# Patient Record
Sex: Female | Born: 1985
Health system: Southern US, Community
[De-identification: ages and names within clinical notes are randomized; demographics above are authoritative.]

## PROBLEM LIST (undated history)

## (undated) ENCOUNTER — Inpatient Hospital Stay (HOSPITAL_COMMUNITY): Payer: Self-pay

## (undated) DIAGNOSIS — Z789 Other specified health status: Secondary | ICD-10-CM

## (undated) DIAGNOSIS — D649 Anemia, unspecified: Secondary | ICD-10-CM

## (undated) HISTORY — PX: OTHER SURGICAL HISTORY: SHX169

---

## 2009-10-20 ENCOUNTER — Emergency Department (HOSPITAL_COMMUNITY): Admission: EM | Admit: 2009-10-20 | Discharge: 2009-10-21 | Payer: Self-pay | Admitting: Emergency Medicine

## 2009-11-30 ENCOUNTER — Emergency Department (HOSPITAL_COMMUNITY): Admission: EM | Admit: 2009-11-30 | Discharge: 2009-11-30 | Payer: Self-pay | Admitting: Family Medicine

## 2009-12-02 ENCOUNTER — Emergency Department (HOSPITAL_COMMUNITY): Admission: EM | Admit: 2009-12-02 | Discharge: 2009-12-02 | Payer: Self-pay | Admitting: Family Medicine

## 2010-04-26 ENCOUNTER — Emergency Department (HOSPITAL_COMMUNITY)
Admission: EM | Admit: 2010-04-26 | Discharge: 2010-04-26 | Payer: Self-pay | Source: Home / Self Care | Admitting: Family Medicine

## 2010-05-25 LAB — RUBELLA ANTIBODY, IGM: Rubella: IMMUNE

## 2010-05-25 LAB — HEPATITIS B SURFACE ANTIGEN: Hepatitis B Surface Ag: NEGATIVE

## 2010-05-25 LAB — ABO/RH: RH Type: POSITIVE

## 2010-05-25 LAB — CBC: HCT: 37 % (ref 36–46)

## 2010-08-06 LAB — URINALYSIS, ROUTINE W REFLEX MICROSCOPIC
Bilirubin Urine: NEGATIVE
Glucose, UA: NEGATIVE mg/dL
Hgb urine dipstick: NEGATIVE
Ketones, ur: 15 mg/dL — AB
Nitrite: NEGATIVE
Protein, ur: NEGATIVE mg/dL
Specific Gravity, Urine: 1.022 (ref 1.005–1.030)
Urobilinogen, UA: 0.2 mg/dL (ref 0.0–1.0)
pH: 5.5 (ref 5.0–8.0)

## 2010-08-06 LAB — POCT I-STAT, CHEM 8
BUN: 12 mg/dL (ref 6–23)
Calcium, Ion: 1.07 mmol/L — ABNORMAL LOW (ref 1.12–1.32)
Chloride: 102 mEq/L (ref 96–112)
Creatinine, Ser: 0.8 mg/dL (ref 0.4–1.2)
Glucose, Bld: 135 mg/dL — ABNORMAL HIGH (ref 70–99)
HCT: 40 % (ref 36.0–46.0)
Hemoglobin: 13.6 g/dL (ref 12.0–15.0)
Potassium: 3.5 meq/L (ref 3.5–5.1)
Sodium: 137 mEq/L (ref 135–145)
TCO2: 26 mmol/L (ref 0–100)

## 2010-08-06 LAB — PREGNANCY, URINE: Preg Test, Ur: NEGATIVE

## 2010-08-13 ENCOUNTER — Inpatient Hospital Stay (INDEPENDENT_AMBULATORY_CARE_PROVIDER_SITE_OTHER)
Admission: RE | Admit: 2010-08-13 | Discharge: 2010-08-13 | Disposition: A | Discharge status: Home / Self Care | Payer: Medicaid Other | Source: Ambulatory Visit | Attending: Family Medicine | Admitting: Family Medicine

## 2010-08-13 DIAGNOSIS — H109 Unspecified conjunctivitis: Secondary | ICD-10-CM

## 2010-09-09 ENCOUNTER — Emergency Department (HOSPITAL_COMMUNITY)
Admission: EM | Admit: 2010-09-09 | Discharge: 2010-09-09 | Disposition: A | Discharge status: Other Acute Inpatient Hospital | Payer: Medicaid Other | Attending: Emergency Medicine | Admitting: Emergency Medicine

## 2010-09-09 ENCOUNTER — Inpatient Hospital Stay (HOSPITAL_COMMUNITY)
Admission: AD | Admit: 2010-09-09 | Discharge: 2010-09-09 | Disposition: A | Discharge status: Home / Self Care | Payer: Medicaid Other | Source: Ambulatory Visit | Attending: Obstetrics and Gynecology | Admitting: Obstetrics and Gynecology

## 2010-09-09 DIAGNOSIS — R109 Unspecified abdominal pain: Secondary | ICD-10-CM | POA: Insufficient documentation

## 2010-09-09 DIAGNOSIS — O99891 Other specified diseases and conditions complicating pregnancy: Secondary | ICD-10-CM | POA: Insufficient documentation

## 2010-09-09 DIAGNOSIS — S3981XA Other specified injuries of abdomen, initial encounter: Secondary | ICD-10-CM | POA: Insufficient documentation

## 2010-09-09 DIAGNOSIS — R350 Frequency of micturition: Secondary | ICD-10-CM | POA: Insufficient documentation

## 2010-09-09 DIAGNOSIS — R51 Headache: Secondary | ICD-10-CM | POA: Insufficient documentation

## 2010-09-09 DIAGNOSIS — M549 Dorsalgia, unspecified: Secondary | ICD-10-CM | POA: Insufficient documentation

## 2010-09-09 DIAGNOSIS — O9989 Other specified diseases and conditions complicating pregnancy, childbirth and the puerperium: Secondary | ICD-10-CM | POA: Insufficient documentation

## 2010-09-09 DIAGNOSIS — R300 Dysuria: Secondary | ICD-10-CM | POA: Insufficient documentation

## 2010-09-09 DIAGNOSIS — N898 Other specified noninflammatory disorders of vagina: Secondary | ICD-10-CM | POA: Insufficient documentation

## 2010-09-09 LAB — URINALYSIS, ROUTINE W REFLEX MICROSCOPIC
Glucose, UA: NEGATIVE mg/dL
Hgb urine dipstick: NEGATIVE
Protein, ur: NEGATIVE mg/dL
pH: 7.5 (ref 5.0–8.0)

## 2010-10-26 ENCOUNTER — Inpatient Hospital Stay (HOSPITAL_COMMUNITY)
Admission: AD | Admit: 2010-10-26 | Discharge: 2010-10-26 | Disposition: A | Discharge status: Home / Self Care | Payer: Medicaid Other | Source: Ambulatory Visit | Attending: Obstetrics and Gynecology | Admitting: Obstetrics and Gynecology

## 2010-10-26 DIAGNOSIS — O99891 Other specified diseases and conditions complicating pregnancy: Secondary | ICD-10-CM

## 2010-10-26 DIAGNOSIS — O9989 Other specified diseases and conditions complicating pregnancy, childbirth and the puerperium: Secondary | ICD-10-CM

## 2010-10-26 DIAGNOSIS — R109 Unspecified abdominal pain: Secondary | ICD-10-CM | POA: Insufficient documentation

## 2010-10-26 LAB — WET PREP, GENITAL
Clue Cells Wet Prep HPF POC: NONE SEEN
Trich, Wet Prep: NONE SEEN
Yeast Wet Prep HPF POC: NONE SEEN

## 2010-10-26 LAB — URINALYSIS, ROUTINE W REFLEX MICROSCOPIC
Glucose, UA: NEGATIVE mg/dL
Ketones, ur: 40 mg/dL — AB
Leukocytes, UA: NEGATIVE
Nitrite: NEGATIVE
Specific Gravity, Urine: 1.025 (ref 1.005–1.030)
pH: 7 (ref 5.0–8.0)

## 2010-11-04 ENCOUNTER — Inpatient Hospital Stay (HOSPITAL_COMMUNITY)
Admission: AD | Admit: 2010-11-04 | Discharge: 2010-11-05 | Disposition: A | Discharge status: Home / Self Care | Payer: Medicaid Other | Source: Ambulatory Visit | Attending: Obstetrics and Gynecology | Admitting: Obstetrics and Gynecology

## 2010-11-04 DIAGNOSIS — R42 Dizziness and giddiness: Secondary | ICD-10-CM

## 2010-11-04 DIAGNOSIS — O9989 Other specified diseases and conditions complicating pregnancy, childbirth and the puerperium: Secondary | ICD-10-CM

## 2010-11-04 DIAGNOSIS — O99891 Other specified diseases and conditions complicating pregnancy: Secondary | ICD-10-CM | POA: Insufficient documentation

## 2010-11-05 LAB — URINALYSIS, ROUTINE W REFLEX MICROSCOPIC
Hgb urine dipstick: NEGATIVE
Nitrite: NEGATIVE
Protein, ur: NEGATIVE mg/dL
Specific Gravity, Urine: 1.01 (ref 1.005–1.030)
Urobilinogen, UA: 0.2 mg/dL (ref 0.0–1.0)

## 2010-11-05 LAB — CBC
HCT: 30.9 % — ABNORMAL LOW (ref 36.0–46.0)
MCHC: 33 g/dL (ref 30.0–36.0)
Platelets: 156 10*3/uL (ref 150–400)
RDW: 14.5 % (ref 11.5–15.5)
WBC: 7 10*3/uL (ref 4.0–10.5)

## 2010-11-05 LAB — DIFFERENTIAL
Basophils Absolute: 0 10*3/uL (ref 0.0–0.1)
Basophils Relative: 0 % (ref 0–1)
Eosinophils Absolute: 0.1 10*3/uL (ref 0.0–0.7)
Eosinophils Relative: 1 % (ref 0–5)
Lymphocytes Relative: 18 % (ref 12–46)
Monocytes Absolute: 0.4 10*3/uL (ref 0.1–1.0)

## 2010-11-15 LAB — STREP B DNA PROBE: GBS: POSITIVE

## 2010-11-27 ENCOUNTER — Encounter (HOSPITAL_COMMUNITY): Payer: Self-pay

## 2010-11-27 ENCOUNTER — Inpatient Hospital Stay (HOSPITAL_COMMUNITY)
Admission: AD | Admit: 2010-11-27 | Discharge: 2010-11-27 | Disposition: A | Discharge status: Home / Self Care | Payer: Medicaid Other | Source: Ambulatory Visit | Attending: Obstetrics and Gynecology | Admitting: Obstetrics and Gynecology

## 2010-11-27 DIAGNOSIS — O212 Late vomiting of pregnancy: Secondary | ICD-10-CM | POA: Insufficient documentation

## 2010-11-27 DIAGNOSIS — R112 Nausea with vomiting, unspecified: Secondary | ICD-10-CM

## 2010-11-27 DIAGNOSIS — O479 False labor, unspecified: Secondary | ICD-10-CM

## 2010-11-27 HISTORY — DX: Other specified health status: Z78.9

## 2010-11-27 LAB — URINALYSIS, ROUTINE W REFLEX MICROSCOPIC
Glucose, UA: NEGATIVE mg/dL
Hgb urine dipstick: NEGATIVE
Ketones, ur: NEGATIVE mg/dL
Leukocytes, UA: NEGATIVE
Protein, ur: NEGATIVE mg/dL
Urobilinogen, UA: 0.2 mg/dL (ref 0.0–1.0)

## 2010-11-27 MED ORDER — PROMETHAZINE HCL 25 MG PO TABS: 25.0000 mg | ORAL_TABLET | Freq: Four times a day (QID) | ORAL | Status: DC | PRN

## 2010-11-27 NOTE — ED Provider Notes (Signed)
History   Pt presents today c/o one episode of N&V and one episode of diarrhea since last pm. She has also had some lower abd pressure. She denies vag dc, bleeding, fever, or any other sx at this time. She reports GFM.  Chief Complaint  Patient presents with  . Abdominal Pain   HPI  OB History    Grav Para Term Preterm Abortions TAB SAB Ect Mult Living   1               Past Medical History  Diagnosis Date  . No pertinent past medical history     Past Surgical History  Procedure Date  . Vaginal polyps removed     Family History  Problem Relation Age of Onset  . Heart disease Maternal Grandmother   . Heart disease Maternal Grandfather   . Diabetes Paternal Grandfather     History  Substance Use Topics  . Smoking status: Never Smoker   . Smokeless tobacco: Not on file  . Alcohol Use: No    Allergies: No Known Allergies  Prescriptions prior to admission  Medication Sig Dispense Refill  . ferrous sulfate 325 (65 FE) MG tablet Take 325 mg by mouth 2 (two) times daily. For supplement       . prenatal vitamin w/FE, FA (PRENATAL 1 + 1) 27-1 MG TABS Take 1 tablet by mouth daily.          Review of Systems  Constitutional: Negative for fever, chills and malaise/fatigue.  Cardiovascular: Negative for chest pain.  Gastrointestinal: Positive for nausea, vomiting, abdominal pain and diarrhea.  Genitourinary: Negative for dysuria, urgency, frequency and hematuria.  Neurological: Negative for dizziness, weakness and headaches.  Psychiatric/Behavioral: Negative for depression and suicidal ideas.   Physical Exam   Blood pressure 138/82, pulse 99, temperature 97.7 F (36.5 C), temperature source Oral, resp. rate 20, height 5\' 4"  (1.626 m), weight 205 lb 12.8 oz (93.35 kg).  Physical Exam  Constitutional: She is oriented to person, place, and time. She appears well-developed and well-nourished. No distress.  Respiratory: Effort normal.  GI: She exhibits no distension. There  is no tenderness. There is no rebound and no guarding.  Genitourinary: No tenderness or bleeding around the vagina. No vaginal discharge found.       Cervix Cl/50/-3.  Neurological: She is alert and oriented to person, place, and time. She is not disoriented.  Psychiatric: She has a normal mood and affect. Her speech is normal and behavior is normal. Thought content normal.    MAU Course  Procedures  NST reactive with irregular ctx. Dr. Ellyn Hack notified. Will dc to home with Rx for phenergan. She has a f/u appt scheduled. Discussed signs and sx of labor. Discussed diet, activity, risks, and precautions.  Henrietta Hoover, [place] 11/27/10 [phone]

## 2010-11-27 NOTE — Initial Assessments (Signed)
Diarrhea since 2200 last night, vomitting started @ 0630 this a.m.  Pt feeling lower abd pressure, denies bleeding or LOF.

## 2010-11-27 NOTE — Progress Notes (Signed)
Pt states she did not feel well last night and had diarrhea. No diarrhea this am but had one episode of vomiting and is feeling abdominal pressure. No bleeding or leaking. Reports good fetal movement.

## 2010-12-03 ENCOUNTER — Encounter (HOSPITAL_COMMUNITY): Payer: Self-pay | Admitting: *Deleted

## 2010-12-03 ENCOUNTER — Inpatient Hospital Stay (HOSPITAL_COMMUNITY)
Admission: AD | Admit: 2010-12-03 | Discharge: 2010-12-03 | Disposition: A | Discharge status: Home / Self Care | Payer: Medicaid Other | Source: Ambulatory Visit | Attending: Obstetrics and Gynecology | Admitting: Obstetrics and Gynecology

## 2010-12-03 DIAGNOSIS — O479 False labor, unspecified: Secondary | ICD-10-CM | POA: Insufficient documentation

## 2010-12-03 HISTORY — DX: Anemia, unspecified: D64.9

## 2010-12-03 NOTE — Progress Notes (Signed)
Pt states she had a little bloody show at 1200, now having contractions every 5 minutes. Reports good fetal movement. No leaking

## 2010-12-03 NOTE — Progress Notes (Signed)
Pt states she has been contracting q 5 min and they are lasting for almost 60 sec for less than an hour.

## 2010-12-04 ENCOUNTER — Encounter (HOSPITAL_COMMUNITY): Payer: Self-pay | Admitting: Anesthesiology

## 2010-12-04 ENCOUNTER — Encounter (HOSPITAL_COMMUNITY)
Admission: AD | Disposition: A | Discharge status: Home / Self Care | Payer: Self-pay | Source: Ambulatory Visit | Attending: Obstetrics and Gynecology

## 2010-12-04 ENCOUNTER — Encounter (HOSPITAL_COMMUNITY): Payer: Self-pay | Admitting: *Deleted

## 2010-12-04 ENCOUNTER — Inpatient Hospital Stay (HOSPITAL_COMMUNITY): Payer: Medicaid Other | Admitting: Anesthesiology

## 2010-12-04 ENCOUNTER — Inpatient Hospital Stay (HOSPITAL_COMMUNITY)
Admission: AD | Admit: 2010-12-04 | Discharge: 2010-12-07 | DRG: 765 | Disposition: A | Discharge status: Home / Self Care | Payer: Medicaid Other | Source: Ambulatory Visit | Attending: Obstetrics and Gynecology | Admitting: Obstetrics and Gynecology

## 2010-12-04 DIAGNOSIS — Z3490 Encounter for supervision of normal pregnancy, unspecified, unspecified trimester: Secondary | ICD-10-CM

## 2010-12-04 DIAGNOSIS — Z22330 Carrier of Group B streptococcus: Secondary | ICD-10-CM

## 2010-12-04 DIAGNOSIS — O99892 Other specified diseases and conditions complicating childbirth: Secondary | ICD-10-CM | POA: Diagnosis present

## 2010-12-04 LAB — CBC
HCT: 36 % (ref 36.0–46.0)
Hemoglobin: 11.9 g/dL — ABNORMAL LOW (ref 12.0–15.0)
MCHC: 33.1 g/dL (ref 30.0–36.0)
RBC: 3.83 MIL/uL — ABNORMAL LOW (ref 3.87–5.11)

## 2010-12-04 LAB — RPR: RPR Ser Ql: NONREACTIVE

## 2010-12-04 SURGERY — Surgical Case
Anesthesia: Epidural | Site: Abdomen | Wound class: Clean Contaminated

## 2010-12-04 MED ORDER — ONDANSETRON HCL 4 MG/2ML IJ SOLN
INTRAMUSCULAR | Status: DC | PRN
  Administered 2010-12-04: 4 mg via INTRAVENOUS

## 2010-12-04 MED ORDER — FLEET ENEMA 7-19 GM/118ML RE ENEM: 1.0000 | ENEMA | RECTAL | Status: DC | PRN

## 2010-12-04 MED ORDER — OXYTOCIN 10 UNIT/ML IJ SOLN
INTRAMUSCULAR | Status: AC
  Filled 2010-12-04: qty 4

## 2010-12-04 MED ORDER — MORPHINE SULFATE 0.5 MG/ML IJ SOLN
INTRAMUSCULAR | Status: AC
  Filled 2010-12-04: qty 10

## 2010-12-04 MED ORDER — TERBUTALINE SULFATE 1 MG/ML IJ SOLN: 0.2500 mg | Freq: Once | INTRAMUSCULAR | Status: AC | PRN

## 2010-12-04 MED ORDER — PHENYLEPHRINE 40 MCG/ML (10ML) SYRINGE FOR IV PUSH (FOR BLOOD PRESSURE SUPPORT)
80.0000 ug | PREFILLED_SYRINGE | INTRAVENOUS | Status: DC | PRN
  Filled 2010-12-04 (×2): qty 5

## 2010-12-04 MED ORDER — SODIUM BICARBONATE 8.4 % IV SOLN
INTRAVENOUS | Status: DC | PRN
  Administered 2010-12-04: 5 mL via EPIDURAL

## 2010-12-04 MED ORDER — DIPHENHYDRAMINE HCL 50 MG/ML IJ SOLN: 12.5000 mg | INTRAMUSCULAR | Status: DC | PRN

## 2010-12-04 MED ORDER — PHENYLEPHRINE 40 MCG/ML (10ML) SYRINGE FOR IV PUSH (FOR BLOOD PRESSURE SUPPORT)
80.0000 ug | PREFILLED_SYRINGE | INTRAVENOUS | Status: DC | PRN
  Filled 2010-12-04: qty 5

## 2010-12-04 MED ORDER — FENTANYL 2.5 MCG/ML BUPIVACAINE 1/10 % EPIDURAL INFUSION (WH - ANES)
INTRAMUSCULAR | Status: DC | PRN
  Administered 2010-12-04: 14 mL/h via EPIDURAL

## 2010-12-04 MED ORDER — ACETAMINOPHEN 325 MG PO TABS: 650.0000 mg | ORAL_TABLET | ORAL | Status: DC | PRN

## 2010-12-04 MED ORDER — OXYTOCIN 20 UNITS IN LACTATED RINGERS INFUSION - SIMPLE
INTRAVENOUS | Status: DC | PRN
  Administered 2010-12-04 – 2010-12-05 (×2): 20 [IU] via INTRAVENOUS

## 2010-12-04 MED ORDER — OXYTOCIN 20 UNITS IN LACTATED RINGERS INFUSION - SIMPLE
1.0000 m[IU]/min | INTRAVENOUS | Status: DC
  Administered 2010-12-04: 3 m[IU]/min via INTRAVENOUS

## 2010-12-04 MED ORDER — EPHEDRINE 5 MG/ML INJ
10.0000 mg | INTRAVENOUS | Status: DC | PRN
  Administered 2010-12-04: 20 mg via INTRAVENOUS
  Filled 2010-12-04 (×2): qty 4

## 2010-12-04 MED ORDER — LIDOCAINE HCL (PF) 1 % IJ SOLN: 30.0000 mL | INTRAMUSCULAR | Status: DC | PRN

## 2010-12-04 MED ORDER — SODIUM BICARBONATE 8.4 % IV SOLN
INTRAVENOUS | Status: AC
  Filled 2010-12-04: qty 50

## 2010-12-04 MED ORDER — LACTATED RINGERS IV SOLN: 500.0000 mL | INTRAVENOUS | Status: DC | PRN

## 2010-12-04 MED ORDER — LACTATED RINGERS IV SOLN
INTRAVENOUS | Status: DC
  Administered 2010-12-04 (×4): via INTRAVENOUS

## 2010-12-04 MED ORDER — ONDANSETRON HCL 4 MG/2ML IJ SOLN
INTRAMUSCULAR | Status: AC
  Filled 2010-12-04: qty 2

## 2010-12-04 MED ORDER — OXYTOCIN 20 UNITS IN LACTATED RINGERS INFUSION - SIMPLE
1.0000 m[IU]/min | INTRAVENOUS | Status: DC
  Administered 2010-12-04: 2 m[IU]/min via INTRAVENOUS
  Filled 2010-12-04: qty 1000

## 2010-12-04 MED ORDER — PENICILLIN G POTASSIUM 5000000 UNITS IJ SOLR
2.5000 10*6.[IU] | INTRAVENOUS | Status: DC
  Administered 2010-12-04 (×3): 2.5 10*6.[IU] via INTRAVENOUS
  Filled 2010-12-04 (×5): qty 2.5

## 2010-12-04 MED ORDER — SODIUM CHLORIDE 0.9 % IV SOLN
3.0000 g | Freq: Four times a day (QID) | INTRAVENOUS | Status: DC
  Administered 2010-12-04: 3 g via INTRAVENOUS
  Filled 2010-12-04 (×4): qty 3

## 2010-12-04 MED ORDER — LACTATED RINGERS IV SOLN
INTRAVENOUS | Status: DC | PRN
  Administered 2010-12-04 – 2010-12-05 (×4): via INTRAVENOUS

## 2010-12-04 MED ORDER — IBUPROFEN 600 MG PO TABS: 600.0000 mg | ORAL_TABLET | Freq: Four times a day (QID) | ORAL | Status: DC | PRN

## 2010-12-04 MED ORDER — PENICILLIN G POTASSIUM 5000000 UNITS IJ SOLR
5.0000 10*6.[IU] | Freq: Once | INTRAVENOUS | Status: DC
  Administered 2010-12-04: 5 10*6.[IU] via INTRAVENOUS
  Filled 2010-12-04: qty 5

## 2010-12-04 MED ORDER — EPHEDRINE 5 MG/ML INJ
10.0000 mg | INTRAVENOUS | Status: DC | PRN
  Filled 2010-12-04: qty 4

## 2010-12-04 MED ORDER — KETOROLAC TROMETHAMINE 60 MG/2ML IM SOLN
60.0000 mg | Freq: Once | INTRAMUSCULAR | Status: AC | PRN
  Administered 2010-12-05: 60 mg via INTRAMUSCULAR

## 2010-12-04 MED ORDER — PENICILLIN G POTASSIUM 5000000 UNITS IJ SOLR: 5.0000 10*6.[IU] | INTRAMUSCULAR | Status: DC

## 2010-12-04 MED ORDER — OXYCODONE-ACETAMINOPHEN 5-325 MG PO TABS: 2.0000 | ORAL_TABLET | ORAL | Status: DC | PRN

## 2010-12-04 MED ORDER — CITRIC ACID-SODIUM CITRATE 334-500 MG/5ML PO SOLN
30.0000 mL | ORAL | Status: DC | PRN
  Administered 2010-12-04: 30 mL via ORAL
  Filled 2010-12-04: qty 15

## 2010-12-04 MED ORDER — MORPHINE SULFATE (PF) 0.5 MG/ML IJ SOLN
INTRAMUSCULAR | Status: DC | PRN
  Administered 2010-12-04: 4 mg via EPIDURAL
  Administered 2010-12-05: 1 mg via INTRAVENOUS

## 2010-12-04 MED ORDER — ONDANSETRON HCL 4 MG/2ML IJ SOLN: 4.0000 mg | Freq: Four times a day (QID) | INTRAMUSCULAR | Status: DC | PRN

## 2010-12-04 MED ORDER — SCOPOLAMINE 1 MG/3DAYS TD PT72
1.0000 | MEDICATED_PATCH | Freq: Once | TRANSDERMAL | Status: DC
  Administered 2010-12-05: 1.5 mg via TRANSDERMAL

## 2010-12-04 MED ORDER — NALBUPHINE SYRINGE 5 MG/0.5 ML
10.0000 mg | INJECTION | INTRAMUSCULAR | Status: DC | PRN
  Administered 2010-12-04: 10 mg via INTRAVENOUS
  Filled 2010-12-04: qty 1

## 2010-12-04 MED ORDER — LIDOCAINE-EPINEPHRINE (PF) 2 %-1:200000 IJ SOLN
INTRAMUSCULAR | Status: AC
  Filled 2010-12-04: qty 20

## 2010-12-04 MED ORDER — LACTATED RINGERS IV SOLN
500.0000 mL | Freq: Once | INTRAVENOUS | Status: AC
  Administered 2010-12-04: 500 mL via INTRAVENOUS

## 2010-12-04 MED ORDER — OXYTOCIN 20 UNITS IN LACTATED RINGERS INFUSION - SIMPLE
125.0000 mL/h | Freq: Once | INTRAVENOUS | Status: DC
  Administered 2010-12-04: 125 mL/h via INTRAVENOUS

## 2010-12-04 MED ORDER — FENTANYL 2.5 MCG/ML BUPIVACAINE 1/10 % EPIDURAL INFUSION (WH - ANES)
14.0000 mL/h | INTRAMUSCULAR | Status: DC
  Administered 2010-12-04 (×2): 14 mL/h via EPIDURAL
  Filled 2010-12-04 (×3): qty 60

## 2010-12-04 SURGICAL SUPPLY — 29 items
CHLORAPREP W/TINT 26ML (MISCELLANEOUS) ×2 IMPLANT
CLOTH BEACON ORANGE TIMEOUT ST (SAFETY) ×2 IMPLANT
CONTAINER PREFILL 10% NBF 15ML (MISCELLANEOUS) IMPLANT
DRAPE UTILITY XL STRL (DRAPES) ×2 IMPLANT
ELECT REM PT RETURN 9FT ADLT (ELECTROSURGICAL) ×2
ELECTRODE REM PT RTRN 9FT ADLT (ELECTROSURGICAL) ×1 IMPLANT
EXTRACTOR VACUUM KIWI (MISCELLANEOUS) IMPLANT
EXTRACTOR VACUUM M CUP 4 TUBE (SUCTIONS) IMPLANT
GLOVE BIO SURGEON STRL SZ 6.5 (GLOVE) ×4 IMPLANT
GLOVE BIO SURGEON STRL SZ8 (GLOVE) ×2 IMPLANT
GOWN BRE IMP SLV AUR LG STRL (GOWN DISPOSABLE) ×4 IMPLANT
KIT ABG SYR 3ML LUER SLIP (SYRINGE) IMPLANT
NEEDLE HYPO 25X5/8 SAFETYGLIDE (NEEDLE) IMPLANT
NS IRRIG 1000ML POUR BTL (IV SOLUTION) ×2 IMPLANT
PACK C SECTION WH (CUSTOM PROCEDURE TRAY) ×2 IMPLANT
RTRCTR C-SECT PINK 25CM LRG (MISCELLANEOUS) ×2 IMPLANT
SLEEVE SCD COMPRESS KNEE MED (MISCELLANEOUS) ×2 IMPLANT
STAPLER VISISTAT 35W (STAPLE) IMPLANT
SUT CHROMIC 1 CTX 36 (SUTURE) ×4 IMPLANT
SUT PLAIN 0 NONE (SUTURE) IMPLANT
SUT PLAIN 2 0 XLH (SUTURE) ×2 IMPLANT
SUT VIC AB 0 CT1 27 (SUTURE) ×3
SUT VIC AB 0 CT1 27XBRD ANBCTR (SUTURE) ×3 IMPLANT
SUT VIC AB 2-0 CT1 27 (SUTURE)
SUT VIC AB 2-0 CT1 TAPERPNT 27 (SUTURE) IMPLANT
SUT VIC AB 4-0 KS 27 (SUTURE) ×2 IMPLANT
TOWEL OR 17X24 6PK STRL BLUE (TOWEL DISPOSABLE) ×4 IMPLANT
TRAY FOLEY CATH 14FR (SET/KITS/TRAYS/PACK) IMPLANT
WATER STERILE IRR 1000ML POUR (IV SOLUTION) ×2 IMPLANT

## 2010-12-04 NOTE — H&P (Signed)
Subjective:  Leslie Hatfield is a 25 y.o. G1 P0 female with EDC 12/03/2010 at 40 and 1/[redacted] weeks gestation who is being admitted for labor management.  Her current obstetrical history is significant for GBS colonizer.  Patient reports contractions since yesterday and no bleeding.   Fetal Movement: normal.    PMH:  Past Medical History  Diagnosis Date  . No pertinent past medical history   . Anemia     PSH:  Past Surgical History  Procedure Date  . Vaginal polyps removed   . Vaginal poloyps     POBGYNHX:  OB History    Grav Para Term Preterm Abortions TAB SAB Ect Mult Living   1                MEDS: PNV  ALL: No Known Allergies  SH:  History   Social History  . Marital Status: Married    Spouse Name: N/A    Number of Children: N/A  . Years of Education: N/A   Occupational History  . Not on file.   Social History Main Topics  . Smoking status: Never Smoker   . Smokeless tobacco: Not on file  . Alcohol Use: No  . Drug Use: No  . Sexually Active: Yes   Other Topics Concern  . Not on file   Social History Narrative  . No narrative on file    FH:  Family History pt adopted  Problem Relation Age of Onset  . Heart disease Maternal Grandmother   . Heart disease Maternal Grandfather   . Diabetes Paternal Grandfather   . Diabetes Paternal Grandmother      Objective:   Vital signs in last 24 hours: Temp:  [98.1 F (36.7 C)-99.1 F (37.3 C)] 98.2 F (36.8 C) (07/17 0721) Pulse Rate:  [95-121] 97  (07/17 0721) Resp:  [18-20] 20  (07/17 0745) BP: (111-134)/(70-92) 126/85 mmHg (07/17 0721) SpO2:  [98 %] 98 % (07/16 1656) Weight:  [91.627 kg (202 lb)-92.806 kg (204 lb 9.6 oz)] 202 lb (91.627 kg) (07/17 0506)   General:   NAD  Lungs:   clear to auscultation bilaterally  Heart:   regular rate and rhythm  Abdomen:  soft, FNT  FHT:  130's R BPM  Presentations: cephalic  Cervix:    Dilation: 2cm   Effacement: 50%   Station:  -2   Lab Review: B+, Ab Scr  neg, Hgb 12.5, Pap WNL, RI, RPR NR, hepBsAg neg, HIV neg, Plt 277K, GC neg, Chl neg, CF neg, AFP, First Tri Screen declined, glucola 105, GBBS+  [place] First tri cwd, anat WNL    Assessment/Plan:  40 and 1/[redacted] weeks gestation. Early latent labor. Obstetrical history significant for N/A, GBBS +.    1. Admit for Labor 2. Likely augment with AROM/Pitocin 3. PCN for GBBS prophylaxis  JBOVARD <MD

## 2010-12-04 NOTE — Anesthesia Postprocedure Evaluation (Signed)
Vital signs stable Patient alert Pain and nausea are controlled No apparent anesthetic complications No follow up care needed Pt may be d/c when neuromuscular function of LE returns

## 2010-12-04 NOTE — Progress Notes (Signed)
DENIES HSV AND MRSA. HURT BAD  ALL NIGHT -  NO SLEEP

## 2010-12-04 NOTE — Progress Notes (Signed)
Pt reports contractions are worsening, denies bleeding or ROM

## 2010-12-04 NOTE — Progress Notes (Signed)
   Subjective: Pt comfortable with epidural  Objective: BP 129/56  Pulse 121  Temp(Src) 98 F (36.7 C) (Oral)  Resp 20  Ht 5\' 4"  (1.626 m)  Wt 91.627 kg (202 lb)  BMI 34.67 kg/m2  SpO2 98%  LMP 02/26/2010      FHT:  FHR: 130 bpm, variability: moderate,  accelerations:  Present,  decelerations:  Present early decels for most part, good scalp stim UC:   regular, every 2-3 minutes--MVU are adequate at 250+ SVE:   Dilation: 4.5 Effacement (%): 80 Station: 0 Exam by:: Dr Senaida Ores  Labs: Lab Results  Component Value Date   WBC 6.9 12/04/2010   HGB 11.9* 12/04/2010   HCT 36.0 12/04/2010   MCV 94.0 12/04/2010   PLT 171 12/04/2010    Assessment / Plan: Protracted active phase  Labor: Pt with no cervical change in hours beyond 4-5 cm. Did initially have some descent, but no good change in last 2 hours.  d/w pt slow progress and probable need for c-section if no change on next check.   Fetal Wellbeing:  Category II Pain Control:  Epidural Constanza Mincy W 12/04/2010, 9:42 PM

## 2010-12-04 NOTE — Progress Notes (Signed)
   Subjective: Pt comfortable with epidural  Objective: BP 109/55  Pulse 117  Temp(Src) 98 F (36.7 C) (Oral)  Resp 20  Ht 5\' 4"  (1.626 m)  Wt 91.627 kg (202 lb)  BMI 34.67 kg/m2  SpO2 98%  LMP 02/26/2010      FHT:  FHR: 135 bpm, variability: moderate,  accelerations:  Present,  decelerations:  Absent UC:   irregular, every 2-7 minutes SVE: 80/2-3/-1,  forebag ruptured  Assessment / Plan: Augmentation of labor, progressing well  Labor: IUPC placed as suspect needs increased pitocin, will adjust accordingly.  Fetal Wellbeing:  Category I Pain Control:  Epidural Dareen Gutzwiller W 12/04/2010, 1:07 PM

## 2010-12-04 NOTE — Progress Notes (Signed)
Leslie Hatfield is a 25 y.o. G1P0 at [redacted]w[redacted]d Subjective:  Pt with some discomfort with contractions, coping for now Objective: BP 126/85  Pulse 97  Temp(Src) 98.2 F (36.8 C) (Oral)  Resp 20  Ht 5\' 4"  (1.626 m)  Wt 91.627 kg (202 lb)  BMI 34.67 kg/m2  LMP 02/26/2010      FHT:  FHR: 135 bpm, variability: moderate,  accelerations:  Present,  decelerations:  Absent UC:   irregular, every 3-4 minutes SVE:   Dilation: 1.5 Effacement (%): 80 Station: -3 AROM clear  Labs: Lab Results  Component Value Date   WBC 7.0 11/05/2010   HGB 10.2* 11/05/2010   HCT 30.9* 11/05/2010   MCV 94.2 11/05/2010   PLT 156 11/05/2010    Assessment / Plan: Augmentation of labor, progressing well  Labor: In latent phase labor, AROM performed and augmenting with pitocin  Fetal Wellbeing:  Category I Pain Control:  Plans epidural  Anticipated MOD:  NSVD  Yaqub Arney W 12/04/2010, 9:33 AM

## 2010-12-04 NOTE — Anesthesia Preprocedure Evaluation (Signed)
Anesthesia Evaluation  Name, MR# and DOB Patient awake  General Assessment Comment  Reviewed: Allergy & Precautions, H&P , Patient's Chart, lab work & pertinent test results and reviewed documented beta blocker date and time   Airway Mallampati: I TM Distance: >3 FB Neck ROM: full    Dental No notable dental hx (+) Teeth Intact   Pulmonaryneg pulmonary ROS    clear to auscultation    Cardiovascular regular Normal   Neuro/PsychNegative Neurological ROS Negative Psych ROS  GI/Hepatic/Renal negative GI ROS, negative Liver ROS, and negative Renal ROS (+)       Endo/Other  Negative Endocrine ROS (+)   Abdominal   Musculoskeletal  Hematology negative hematology ROS (+)   Peds  Reproductive/Obstetrics (+) Pregnancy   Anesthesia Other Findings             Anesthesia Physical Anesthesia Plan  ASA: II  Anesthesia Plan: Epidural   Post-op Pain Management:    Induction:   Airway Management Planned:   Additional Equipment:   Intra-op Plan:   Post-operative Plan:   Informed Consent: I have reviewed the patients History and Physical, chart, labs and discussed the procedure including the risks, benefits and alternatives for the proposed anesthesia with the patient or authorized representative who has indicated his/her understanding and acceptance.   Dental Advisory Given and History available from chart only  Plan Discussed with:   Anesthesia Plan Comments:         Anesthesia Quick Evaluation

## 2010-12-04 NOTE — Progress Notes (Signed)
   Subjective: Pt still comfortable with epidural  Objective: BP 112/71  Pulse 113  Temp(Src) 98.4 F (36.9 C) (Oral)  Resp 18  Ht 5\' 4"  (1.626 m)  Wt 91.627 kg (202 lb)  BMI 34.67 kg/m2  SpO2 98%  LMP 02/26/2010      FHT:  FHR: 150 bpm, variability: moderate,  accelerations:  Present,  decelerations:  Absent UC:   regular, every 2-3 minutes SVE: 80/4/0  Some caput, probably OP     Assessment / Plan: Protracted latent phase  Labor: On pitocin with adequate MVU's, some descent but slow dilation, continue to folow closely  Fetal Wellbeing:  Category I Pain Control:  Epidural Leslie Hatfield 12/04/2010, 6:38 PM

## 2010-12-04 NOTE — Progress Notes (Signed)
Leslie Hatfield is a 25 y.o. G1P0 at [redacted]w[redacted]d admitted in labor and augmented all day with pitocin  Subjective: Pt feeling some occasional ctx pain on left, mostly comfortable with epidural Objective: BP 132/78  Pulse 126  Temp(Src) 99.3 F (37.4 C) (Oral)  Resp 20  Ht 5\' 4"  (1.626 m)  Wt 91.627 kg (202 lb)  BMI 34.67 kg/m2  SpO2 98%  LMP 02/26/2010      FHT:  FHR: 190 bpm, variability: moderate,  accelerations:  Present,  decelerations:  Present variable UC:   regular, every 2 minutes SVE:   Dilation: 4.5 Effacement (%): 80 Station: 0 Exam by:: Dr Senaida Ores  Labs: Lab Results  Component Value Date   WBC 6.9 12/04/2010   HGB 11.9* 12/04/2010   HCT 36.0 12/04/2010   MCV 94.0 12/04/2010   PLT 171 12/04/2010    Assessment / Plan: Arrest in active phase of labor  Labor: Pt with no cervical change in hours, vertex made it to 0 station then no further progress.  Significant caput noted.  Now developing temp and fetal tachycardia.  Will start Unasyn now.  d/w pt need to proceed with C/S for arrest of dilation.  Procedure, risks, benefits reviewed.  Pt and husband agree to proceed.  OR notified and prepping.  Leslie Hatfield 12/04/2010, 10:51 PM

## 2010-12-04 NOTE — Anesthesia Procedure Notes (Signed)
Epidural Patient location during procedure: OB Start time: 12/04/2010 11:20 AM End time: 12/04/2010 11:30 AM Reason for block: procedure for pain  Staffing Anesthesiologist: Sandrea Hughs Performed by: anesthesiologist   Preanesthetic Checklist Completed: patient identified, site marked, surgical consent, pre-op evaluation, timeout performed, IV checked, risks and benefits discussed and monitors and equipment checked  Epidural Patient position: sitting Prep: DuraPrep Patient monitoring: continuous pulse ox and blood pressure Approach: midline Injection technique: LOR air  Needle:  Needle type: Tuohy  Needle gauge: 17 G Needle length: 9 cm Needle insertion depth: 5 cm cm Catheter type: closed end flexible Catheter size: 19 Gauge Catheter at skin depth: 10 cm Test dose: negative  Assessment Sensory level: T8 Events: blood not aspirated, injection not painful, no injection resistance, negative IV test and no paresthesia  Additional Notes Pt comfortable. See nursing notes for VS and FHR

## 2010-12-05 ENCOUNTER — Encounter (HOSPITAL_COMMUNITY): Payer: Self-pay | Admitting: Neonatology

## 2010-12-05 LAB — CBC
HCT: 29 % — ABNORMAL LOW (ref 36.0–46.0)
Hemoglobin: 9.5 g/dL — ABNORMAL LOW (ref 12.0–15.0)
MCH: 30.8 pg (ref 26.0–34.0)
MCV: 94.2 fL (ref 78.0–100.0)
RBC: 3.08 MIL/uL — ABNORMAL LOW (ref 3.87–5.11)
WBC: 10.8 10*3/uL — ABNORMAL HIGH (ref 4.0–10.5)

## 2010-12-05 MED ORDER — SIMETHICONE 80 MG PO CHEW: 80.0000 mg | CHEWABLE_TABLET | ORAL | Status: DC | PRN

## 2010-12-05 MED ORDER — MENTHOL 3 MG MT LOZG: 1.0000 | LOZENGE | OROMUCOSAL | Status: DC | PRN

## 2010-12-05 MED ORDER — ZOLPIDEM TARTRATE 5 MG PO TABS: 5.0000 mg | ORAL_TABLET | Freq: Every evening | ORAL | Status: DC | PRN

## 2010-12-05 MED ORDER — WITCH HAZEL-GLYCERIN EX PADS: MEDICATED_PAD | CUTANEOUS | Status: DC | PRN

## 2010-12-05 MED ORDER — KETOROLAC TROMETHAMINE 60 MG/2ML IM SOLN
INTRAMUSCULAR | Status: AC
  Administered 2010-12-05: 60 mg via INTRAMUSCULAR
  Filled 2010-12-05: qty 2

## 2010-12-05 MED ORDER — PRENATAL PLUS 27-1 MG PO TABS
1.0000 | ORAL_TABLET | Freq: Every day | ORAL | Status: DC
  Administered 2010-12-05 – 2010-12-07 (×3): 1 via ORAL
  Filled 2010-12-05 (×3): qty 1

## 2010-12-05 MED ORDER — ONDANSETRON HCL 4 MG/2ML IJ SOLN: 4.0000 mg | Freq: Once | INTRAMUSCULAR | Status: AC | PRN

## 2010-12-05 MED ORDER — KETOROLAC TROMETHAMINE 30 MG/ML IJ SOLN: 30.0000 mg | Freq: Four times a day (QID) | INTRAMUSCULAR | Status: DC | PRN

## 2010-12-05 MED ORDER — OXYTOCIN 20 UNITS IN LACTATED RINGERS INFUSION - SIMPLE
125.0000 mL/h | INTRAVENOUS | Status: DC
Start: 2010-12-05 — End: 2010-12-05

## 2010-12-05 MED ORDER — NALOXONE HCL 0.4 MG/ML IJ SOLN: 0.4000 mg | INTRAMUSCULAR | Status: DC | PRN

## 2010-12-05 MED ORDER — ONDANSETRON HCL 4 MG PO TABS: 4.0000 mg | ORAL_TABLET | ORAL | Status: DC | PRN

## 2010-12-05 MED ORDER — SODIUM CHLORIDE 0.9 % IV SOLN
3.0000 g | Freq: Four times a day (QID) | INTRAVENOUS | Status: AC
  Administered 2010-12-05 (×2): 3 g via INTRAVENOUS
  Filled 2010-12-05 (×2): qty 3

## 2010-12-05 MED ORDER — TETANUS-DIPHTH-ACELL PERTUSSIS 5-2.5-18.5 LF-MCG/0.5 IM SUSP
0.5000 mL | Freq: Once | INTRAMUSCULAR | Status: DC
  Filled 2010-12-05: qty 0.5

## 2010-12-05 MED ORDER — IBUPROFEN 400 MG PO TABS: 200.0000 mg | ORAL_TABLET | Freq: Four times a day (QID) | ORAL | Status: DC | PRN

## 2010-12-05 MED ORDER — DIPHENHYDRAMINE HCL 25 MG PO CAPS: 25.0000 mg | ORAL_CAPSULE | Freq: Four times a day (QID) | ORAL | Status: DC | PRN

## 2010-12-05 MED ORDER — ONDANSETRON HCL 4 MG/2ML IJ SOLN: 4.0000 mg | Freq: Three times a day (TID) | INTRAMUSCULAR | Status: DC | PRN

## 2010-12-05 MED ORDER — IBUPROFEN 600 MG PO TABS
600.0000 mg | ORAL_TABLET | Freq: Four times a day (QID) | ORAL | Status: DC | PRN
  Filled 2010-12-05 (×3): qty 1

## 2010-12-05 MED ORDER — IBUPROFEN 600 MG PO TABS
600.0000 mg | ORAL_TABLET | Freq: Four times a day (QID) | ORAL | Status: DC
  Administered 2010-12-05 – 2010-12-07 (×7): 600 mg via ORAL
  Filled 2010-12-05 (×4): qty 1

## 2010-12-05 MED ORDER — NALBUPHINE HCL 10 MG/ML IJ SOLN
5.0000 mg | INTRAMUSCULAR | Status: AC | PRN
Start: 2010-12-05 — End: 2010-12-06
  Filled 2010-12-05: qty 1

## 2010-12-05 MED ORDER — SODIUM CHLORIDE 0.9 % IV SOLN
1.0000 ug/kg/h | INTRAVENOUS | Status: DC | PRN
  Filled 2010-12-05: qty 2.5

## 2010-12-05 MED ORDER — DIPHENHYDRAMINE HCL 25 MG PO CAPS
25.0000 mg | ORAL_CAPSULE | ORAL | Status: DC | PRN
  Administered 2010-12-05: 25 mg via ORAL
  Filled 2010-12-05: qty 1

## 2010-12-05 MED ORDER — OXYCODONE-ACETAMINOPHEN 5-325 MG PO TABS
1.0000 | ORAL_TABLET | ORAL | Status: DC | PRN
  Administered 2010-12-05 – 2010-12-07 (×4): 1 via ORAL
  Filled 2010-12-05 (×4): qty 1

## 2010-12-05 MED ORDER — SIMETHICONE 80 MG PO CHEW
80.0000 mg | CHEWABLE_TABLET | Freq: Three times a day (TID) | ORAL | Status: DC
  Administered 2010-12-05 – 2010-12-07 (×7): 80 mg via ORAL

## 2010-12-05 MED ORDER — MEPERIDINE HCL 25 MG/ML IJ SOLN: 6.2500 mg | INTRAMUSCULAR | Status: DC | PRN

## 2010-12-05 MED ORDER — SCOPOLAMINE 1 MG/3DAYS TD PT72
MEDICATED_PATCH | TRANSDERMAL | Status: AC
  Administered 2010-12-05: 1.5 mg via TRANSDERMAL
  Filled 2010-12-05: qty 1

## 2010-12-05 MED ORDER — SENNOSIDES-DOCUSATE SODIUM 8.6-50 MG PO TABS
1.0000 | ORAL_TABLET | Freq: Every day | ORAL | Status: DC
  Administered 2010-12-05: 1 via ORAL
  Administered 2010-12-06: 2 via ORAL

## 2010-12-05 MED ORDER — SODIUM CHLORIDE 0.9 % IJ SOLN: 3.0000 mL | INTRAMUSCULAR | Status: DC | PRN

## 2010-12-05 MED ORDER — HYDROMORPHONE HCL 1 MG/ML IJ SOLN
0.2500 mg | INTRAMUSCULAR | Status: DC | PRN
Start: 2010-12-05 — End: 2010-12-07

## 2010-12-05 MED ORDER — ONDANSETRON HCL 4 MG/2ML IJ SOLN: 4.0000 mg | INTRAMUSCULAR | Status: DC | PRN

## 2010-12-05 MED ORDER — NALBUPHINE HCL 10 MG/ML IJ SOLN
5.0000 mg | INTRAMUSCULAR | Status: AC | PRN
  Filled 2010-12-05: qty 1

## 2010-12-05 MED ORDER — DIPHENHYDRAMINE HCL 50 MG/ML IJ SOLN
25.0000 mg | INTRAMUSCULAR | Status: DC | PRN
  Administered 2010-12-05: 25 mg via INTRAMUSCULAR
  Filled 2010-12-05: qty 1

## 2010-12-05 MED ORDER — LACTATED RINGERS IV SOLN
INTRAVENOUS | Status: DC
  Administered 2010-12-05: 03:00:00 via INTRAVENOUS

## 2010-12-05 MED ORDER — KETOROLAC TROMETHAMINE 30 MG/ML IJ SOLN: 15.0000 mg | Freq: Once | INTRAMUSCULAR | Status: AC | PRN

## 2010-12-05 NOTE — Progress Notes (Signed)
Dr Senaida Ores in to perform SVE. Decision made for primary C/S for failure to progress. Prep for OR began

## 2010-12-05 NOTE — Progress Notes (Signed)
Mom reports that baby just fed 1 hour ago for 20 minutes. Has been sleepy at some feedings. Reviewed awakening techniques. No questions art present.

## 2010-12-05 NOTE — Progress Notes (Signed)
Subjective: Postpartum Day 1: Cesarean Delivery Patient reports no problems with incisional pain and tolerating PO.    Objective: Vital signs in last 24 hours: Temp:  [97.8 F (36.6 C)-99.3 F (37.4 C)] 98.3 F (36.8 C) (07/18 0630) Pulse Rate:  [82-134] 97  (07/18 0630) Resp:  [16-22] 18  (07/18 0630) BP: (80-156)/(44-131) 115/68 mmHg (07/18 0630) SpO2:  [94 %-98 %] 96 % (07/18 0630)  Physical Exam:  General: alert Lochia: appropriate Uterine Fundus: firm Incision: clean dry and intact UOP adequate   Basename 12/05/10 0510 12/04/10 0715  HGB 9.5* 11.9*  HCT 29.0* 36.0    Assessment/Plan: Status post Cesarean section. Doing well postoperatively.  Continue current care.  Oliver Pila 12/05/2010, 8:12 AM

## 2010-12-05 NOTE — Brief Op Note (Signed)
12/04/2010 - 12/05/2010  12:23 AM  PATIENT:  Leslie Hatfield  25 y.o. female G1P0 at 40+ weeks  PRE-OPERATIVE DIAGNOSIS:  1) Term pregnancy at 40+ weeks                                                       2) Arrest of dilatation at 4-5 cm                                                        3) maternal fever  POST-OPERATIVE DIAGNOSIS:  Same with OP presentation of baby  Findings:   Female At 2339; Direct OP                   Apgar 8/9, weight 8#6oz                   Normal uterus, tubes and ovaries       PROCEDURE:  Procedure(s):  LOW TRANSVERSE CESAREAN SECTION WITH 2 LAYER CLOSURE OF UTERUS  SURGEON:  Surgeon(s): Oliver Pila   ANESTHESIA:   epidural  ESTIMATED BLOOD LOSS:  800cc   BLOOD ADMINISTERED:none  DRAINS: none   LOCAL MEDICATIONS USED:  NONE  SPECIMEN:  Placenta to L&D  DISPOSITION OF SPECIMEN:  L&D  COUNTS:  YES  TOURNIQUET:  * No tourniquets in log *    PLAN OF CARE: Routine post-op care  PATIENT DISPOSITION:  PACU - hemodynamically stable.

## 2010-12-05 NOTE — Progress Notes (Signed)
UR chart review completed.  

## 2010-12-05 NOTE — Transfer of Care (Signed)
Immediate Anesthesia Transfer of Care Note  Patient: Leslie Hatfield  Procedure(s) Performed:  CESAREAN SECTION  Patient Location: PACU  Anesthesia Type: Epidural  Level of Consciousness: awake, alert  and oriented  Airway & Oxygen Therapy: Patient Spontanous Breathing  Post-op Assessment: Report given to PACU RN and Post -op Vital signs reviewed and stable  Post vital signs: Reviewed and stable  Complications: No apparent anesthesia complications

## 2010-12-05 NOTE — Procedures (Signed)
Preoperative diagnosis #1 Term pregnancy at 40+ weeks delivered                                       #2  Arrest of dilation at 4-5 cm                                       #3  Maternal fever  Postoperative diagnosis same  Procedure  Primary low transverse C-section with 2 layer closure of the uterus  Surgeon Dr. Huel Cote  Anesthesia epidural  Findings there is a viable female infant in the vertex presentation OP weight 8 lbs. 6 oz., Apgars 8 and 9 There was also noted to be normal uterus tubes and ovaries  Fluids   Estimated blood loss 800 cc   Urine output approximately 150 cc of clear urine   IV fluids 2200 cc LR  Specimen placenta was sent to labor and delivery  Procedure Patient was taken to the operating room where epidural anesthesia was found to be adequate by Allis clamp test was prepped and draped in the normal sterile fashion. After an appropriate time out was performed, a Pfannenstiel skin incision was made with the scalpel and carried through to the underlying layer of fascia by sharp dissection and Bovie cautery. The fascia was then nicked in the midline and the incision was extended laterally with Mayo scissors. The inferior aspect of the incision was then grasped with Kocher clamps and dissected off the underlying rectus muscles, in a similar fashion the superior aspect was likewise dissected. The rectus muscles were then separated in the midline and the peritoneal cavity entered bluntly. This incision was then extended both superiorly and inferiorly with careful attention to avoid both bowel and bladder. The Alexis self-retaining retractor was then placed within the incision and the lower uterine segment exposed. A bladder flap was created with Metzenbaum scissors and then the lower uterine segment was incised in a transverse fashion. The cavity itself was entered bluntly and the infant's head was delivered atraumatically and bulb suctioned. The remainder of the body  delivered without difficulty the cord was clamped and cut and infant handed off to the waiting pediatricians. Cord blood was obtained and the placenta was delivered spontaneously the uterus was cleared of all clots and debris with a moist lap sponge.  The uterine incision was then closed in 2 layers the first a running locked layer of 1 chromic and the second an imbricating layer of the same suture. Good hemostasis was noted. The tubes and ovaries were inspected and found to be normal and the gutters were cleared of all clots and debris. At this point as all appeared hemostatic the Alexis retractor was removed and the rectus and peritoneal layers reapproximated with several interrupted mattress sutures of 0 Vicryl. The fascia was then closed with 0 Vicryl in a running fashion. The subcutaneous tissue was reapproximated with 30 plain in a running fashion and the skin was closed with 3-0 Vicryl and a subcuticular stitch.  Sponge lap and needle counts were correct x2 and the patient was taken to the recovery room in excellent condition.

## 2010-12-05 NOTE — Consult Note (Addendum)
Asked to attend delivery of this baby by C/S at 40 1/7 weeks for FTP. Mom is GBS pos, treated. Infant had spontaneous cry at birth. Dried. Apgars 8/9. Marked peeling noted. To CN. Care to assigned Ped. Edson Snowball, MD

## 2010-12-06 NOTE — Progress Notes (Signed)
Subjective: Postpartum Day 2: Cesarean Delivery Patient reports tolerating PO, + flatus and no problems voiding.    Objective: Vital signs in last 24 hours: Temp:  [97.6 F (36.4 C)-99.4 F (37.4 C)] 97.6 F (36.4 C) (07/19 0540) Pulse Rate:  [70-102] 70  (07/19 0540) Resp:  [18-20] 18  (07/19 0540) BP: (97-115)/(51-74) 103/64 mmHg (07/19 0540) SpO2:  [96 %-99 %] 97 % (07/19 0030)  Physical Exam:  General: alert Lochia: appropriate Uterine Fundus: firm Incision: healing well DVT Evaluation: No evidence of DVT seen on physical exam.   Basename 12/05/10 0510 12/04/10 0715  HGB 9.5* 11.9*  HCT 29.0* 36.0    Assessment/Plan: Status post Cesarean section. Doing well postoperatively.  Continue current care.  Oliver Pila 12/06/2010, 9:42 AM

## 2010-12-06 NOTE — Progress Notes (Deleted)
Mother states that she is slightly sore., reviewed proper latch and positioning. Gave hand pump.

## 2010-12-07 MED ORDER — OXYCODONE-ACETAMINOPHEN 5-325 MG PO TABS: 1.0000 | ORAL_TABLET | ORAL | Status: AC | PRN

## 2010-12-07 MED ORDER — PRENATAL PLUS 27-1 MG PO TABS: 1.0000 | ORAL_TABLET | Freq: Every day | ORAL | Status: DC

## 2010-12-07 MED ORDER — IBUPROFEN 600 MG PO TABS: 600.0000 mg | ORAL_TABLET | Freq: Four times a day (QID) | ORAL | Status: AC | PRN

## 2010-12-07 NOTE — Progress Notes (Addendum)
Subjective: Postpartum Day 3: Cesarean Delivery Patient reports incisional pain, tolerating PO and no problems voiding.    Objective: Vital signs in last 24 hours: Temp:  [97.3 F (36.3 C)-98.1 F (36.7 C)] 97.9 F (36.6 C) (07/20 0541) Pulse Rate:  [52-88] 52  (07/20 0541) Resp:  [18] 18  (07/20 0541) BP: (110-117)/(59-73) 117/59 mmHg (07/20 0541)  Physical Exam:  General: alert and no distress Lochia: appropriate Uterine Fundus: firm Incision: healing well    Basename 12/05/10 0510  HGB 9.5*  HCT 29.0*    Assessment/Plan: Status post Cesarean section. Doing well postoperatively.  Discharge home with standard precautions and return to clinic in 4-6 weeks.  Circumcision at office.  D/c with Motrin, Percocet, PNV  BOVARD,Rheagan Nayak 12/07/2010, 8:00 AM

## 2010-12-07 NOTE — Discharge Summary (Signed)
Obstetric Discharge Summary Reason for Admission: onset of labor Prenatal Procedures: none Intrapartum Procedures: cesarean: low cervical, transverse,  Postpartum Procedures: none Complications-Operative and Postpartum: none  Hemoglobin  Date Value Range Status  12/05/2010 9.5* 12.0-15.0 (g/dL) Final     DELTA CHECK NOTED     REPEATED TO VERIFY     HCT  Date Value Range Status  12/05/2010 29.0* 36.0-46.0 (%) Final    Discharge Diagnoses: Term Pregnancy-delivered  Discharge Information: Date: 12/07/2010 Activity: pelvic rest Diet: routine Medications: PNV, Ibuprophen and Percocet, Hgb decreased, to take PNV daily Condition: stable Instructions: refer to practice specific booklet Discharge to: home   Newborn Data: Live born  Information for the patient's newborn:  Tinslee, Klare [[id number]]  female ; APGAR , ; weight ;  Home with mother.  Leslie Hatfield,Leslie Hatfield 12/07/2010, 8:12 AM

## 2010-12-07 NOTE — Progress Notes (Signed)
Mother states infant cluster fed durning the night, she denies being sore and describes good latch.. Reviewed pre pumping during engorgement phase. Mother very receptive to teaching.

## 2010-12-09 ENCOUNTER — Inpatient Hospital Stay (HOSPITAL_COMMUNITY): Admission: RE | Admit: 2010-12-09 | Payer: Medicaid Other | Source: Ambulatory Visit

## 2010-12-20 ENCOUNTER — Encounter (HOSPITAL_COMMUNITY): Payer: Self-pay | Admitting: Obstetrics and Gynecology

## 2011-04-23 ENCOUNTER — Emergency Department (INDEPENDENT_AMBULATORY_CARE_PROVIDER_SITE_OTHER)
Admission: EM | Admit: 2011-04-23 | Discharge: 2011-04-23 | Disposition: A | Discharge status: Home / Self Care | Payer: Medicaid Other | Source: Home / Self Care | Attending: Emergency Medicine | Admitting: Emergency Medicine

## 2011-04-23 ENCOUNTER — Encounter (HOSPITAL_COMMUNITY): Payer: Self-pay | Admitting: *Deleted

## 2011-04-23 DIAGNOSIS — J111 Influenza due to unidentified influenza virus with other respiratory manifestations: Secondary | ICD-10-CM

## 2011-04-23 MED ORDER — GUAIFENESIN-CODEINE 100-10 MG/5ML PO SYRP: 5.0000 mL | ORAL_SOLUTION | Freq: Three times a day (TID) | ORAL | Status: AC | PRN

## 2011-04-23 NOTE — ED Notes (Signed)
2 days of fever--up to 102-- chills, generalized aching and coughing

## 2011-04-23 NOTE — ED Provider Notes (Signed)
History     CSN: [id number] Arrival date & time: 04/23/2011 12:16 PM   First MD Initiated Contact with Patient 04/23/11 1215      Chief Complaint  Patient presents with  . Fever    (Consider location/radiation/quality/duration/timing/severity/associated sxs/prior treatment) HPI Comments: X 2 days "body aches" and fevers with some coughing not much, feeling tired no energy NO SOB  Patient is a 25 y.o. female presenting with fever. The history is provided by the patient.  Fever Primary symptoms of the febrile illness include fever, cough, myalgias and arthralgias. Primary symptoms do not include headaches, wheezing, shortness of breath, nausea or vomiting. The current episode started yesterday.    Past Medical History  Diagnosis Date  . No pertinent past medical history   . Anemia     Past Surgical History  Procedure Date  . Vaginal polyps removed   . Vaginal poloyps   . Cesarean section 12/04/2010    Procedure: CESAREAN SECTION;  Surgeon: Oliver Pila;  Location: [facility];  Service: Gynecology;  Laterality: N/A;  . Cesarean section     Family History  Problem Relation Age of Onset  . Heart disease Maternal Grandmother   . Heart disease Maternal Grandfather   . Diabetes Paternal Grandfather   . Diabetes Paternal Grandmother     History  Substance Use Topics  . Smoking status: Never Smoker   . Smokeless tobacco: Not on file  . Alcohol Use: No    OB History    Grav Para Term Preterm Abortions TAB SAB Ect Mult Living   1 1 1              Review of Systems  Constitutional: Positive for fever.  Respiratory: Positive for cough. Negative for shortness of breath and wheezing.   Gastrointestinal: Negative for nausea and vomiting.  Musculoskeletal: Positive for myalgias and arthralgias.  Neurological: Negative for headaches.    Allergies  Review of patient's allergies indicates no known allergies.  Home Medications   Current Outpatient Rx  Name Route Sig  Dispense Refill  . FERROUS SULFATE 325 (65 FE) MG PO TABS Oral Take 325 mg by mouth 2 (two) times daily. For supplement    . PRENATAL PLUS 27-1 MG PO TABS Oral Take 1 tablet by mouth daily.      Marland Kitchen PRENATAL PLUS 27-1 MG PO TABS Oral Take 1 tablet by mouth daily. 30 each 12    BP 113/68  Pulse 121  Temp(Src) 99.4 F (37.4 C) (Oral)  Resp 18  SpO2 99%  Breastfeeding? Unknown  Physical Exam  Nursing note and vitals reviewed. HENT:  Right Ear: Hearing and tympanic membrane normal.  Left Ear: Hearing and tympanic membrane normal.  Mouth/Throat: Uvula is midline and mucous membranes are normal. Posterior oropharyngeal erythema present.  Cardiovascular: Normal heart sounds and normal pulses.   No extrasystoles are present. Tachycardia present.  Exam reveals no gallop.   Pulmonary/Chest: Effort normal and breath sounds normal. No respiratory distress. She has no decreased breath sounds. She has no wheezes. She has no rhonchi. She has no rales.  Skin: Skin is warm. She is not diaphoretic.    ED Course  Procedures (including critical care time)  Labs Reviewed - No data to display No results found.   No diagnosis found.    MDM  ILI < 48 hours. No dyspnea.         Jimmie Molly, MD 04/23/11 1310

## 2011-05-25 ENCOUNTER — Emergency Department (HOSPITAL_COMMUNITY)
Admission: EM | Admit: 2011-05-25 | Discharge: 2011-05-25 | Disposition: A | Discharge status: Home / Self Care | Payer: Medicaid Other | Source: Home / Self Care | Attending: Emergency Medicine | Admitting: Emergency Medicine

## 2011-05-25 ENCOUNTER — Encounter (HOSPITAL_COMMUNITY): Payer: Self-pay | Admitting: *Deleted

## 2011-05-25 ENCOUNTER — Emergency Department (INDEPENDENT_AMBULATORY_CARE_PROVIDER_SITE_OTHER): Payer: Medicaid Other

## 2011-05-25 DIAGNOSIS — J111 Influenza due to unidentified influenza virus with other respiratory manifestations: Secondary | ICD-10-CM

## 2011-05-25 MED ORDER — TRAMADOL HCL 50 MG PO TABS: 100.0000 mg | ORAL_TABLET | Freq: Three times a day (TID) | ORAL | Status: DC | PRN

## 2011-05-25 MED ORDER — BENZONATATE 200 MG PO CAPS: 200.0000 mg | ORAL_CAPSULE | Freq: Three times a day (TID) | ORAL | Status: DC | PRN

## 2011-05-25 NOTE — ED Provider Notes (Signed)
Chief Complaint  Patient presents with  . Nausea  . Dizziness  . Cough  . Generalized Body Aches  . Nasal Congestion  . Fever    History of Present Illness:  Leslie Hatfield has had a two-day history of fever of 101.2, chills, sweats, dizziness, nausea, abdominal pain, generalized aching, cough productive of small amounts of clear to yellow sputum, wheezing, nasal congestion with yellow drainage, headache, right ear pain, and sore throat. She denies any exposure to the flu. Her was seen here last night with pneumonia and is on a Z-Pak. She has not gotten the flu vaccine.  Review of Systems:  Other than noted above, the patient denies any of the following symptoms. Systemic:  No fever, chills, sweats, fatigue, myalgias, headache, or anorexia. Eye:  No redness, pain or drainage. ENT:  No earache, nasal congestion, rhinorrhea, sinus pressure, or sore throat. Lungs:  No cough, sputum production, wheezing, shortness of breath. Or chest pain. GI:  No nausea, vomiting, abdominal pain or diarrhea. Skin:  No rash or itching.  PMFSH:  Past medical history, family history, social history, meds, and allergies were reviewed.  Physical Exam:   Vital signs:  BP 111/57  Pulse 126  Temp(Src) 99.5 F (37.5 C) (Oral)  Resp 21  SpO2 100%  LMP 05/15/2011  Breastfeeding? No General:  Alert, in no distress. Eye:  No conjunctival injection or drainage. ENT:  TMs and canals were normal, without erythema or inflammation.  Nasal mucosa was clear and uncongested, without drainage.  Mucous membranes were moist.  Pharynx was clear, without exudate or drainage.  There were no oral ulcerations or lesions. Neck:  Supple, no adenopathy, tenderness or mass. Lungs:  No respiratory distress.  Lungs were clear to auscultation, without wheezes, rales or rhonchi.  Breath sounds were clear and equal bilaterally. Heart:  Regular rhythm, without gallops, murmers or rubs. Skin:  Clear, warm, and dry, without rash or  lesions.  Labs:   Results for orders placed during the hospital encounter of 12/04/10  CBC      Component Value Range   WBC 6.9  4.0 - 10.5 (K/uL)   RBC 3.83 (*) 3.87 - 5.11 (MIL/uL)   Hemoglobin 11.9 (*) 12.0 - 15.0 (g/dL)   HCT 16.1  09.6 - 04.5 (%)   MCV 94.0  78.0 - 100.0 (fL)   MCH 31.1  26.0 - 34.0 (pg)   MCHC 33.1  30.0 - 36.0 (g/dL)   RDW 40.9  81.1 - 91.4 (%)   Platelets 171  150 - 400 (K/uL)  RPR      Component Value Range   RPR NON REACTIVE  NON REACTIVE   CBC      Component Value Range   WBC 10.8 (*) 4.0 - 10.5 (K/uL)   RBC 3.08 (*) 3.87 - 5.11 (MIL/uL)   Hemoglobin 9.5 (*) 12.0 - 15.0 (g/dL)   HCT 78.2 (*) 95.6 - 46.0 (%)   MCV 94.2  78.0 - 100.0 (fL)   MCH 30.8  26.0 - 34.0 (pg)   MCHC 32.8  30.0 - 36.0 (g/dL)   RDW 21.3  08.6 - 57.8 (%)   Platelets 136 (*) 150 - 400 (K/uL)     Radiology:  Dg Chest 2 View  05/25/2011  *RADIOLOGY REPORT*  Clinical Data: Fever, cough  CHEST - 2 VIEW  Comparison: None.  Findings: Lungs are clear. No pleural effusion or pneumothorax.  Cardiomediastinal silhouette is within normal limits.  Visualized osseous structures are within normal limits.  IMPRESSION: Normal chest radiographs.  Original Report Authenticated By: Charline Bills, M.D.    Medications given in [facility]:  None  Assessment:   Diagnoses that have been ruled out:  Diagnoses that are still under consideration:  Final diagnoses:  Influenza-like illness     Plan:   1.  The following meds were prescribed:   New Prescriptions   BENZONATATE (TESSALON) 200 MG CAPSULE    Take 1 capsule (200 mg total) by mouth 3 (three) times daily as needed for cough.   TRAMADOL (ULTRAM) 50 MG TABLET    Take 2 tablets (100 mg total) by mouth every 8 (eight) hours as needed for pain.   2.  The patient was instructed in symptomatic care and handouts were given. 3.  The patient was told to return if becoming worse in any way, if no better in 3 or 4 days, and given some red flag symptoms that  would indicate earlier return. 4.  The patient has an influenza like illness.  She was given instructions about red flag symptoms and instructed to return if no better in 3 days or if symptoms worsen.  Will treat symptomatically.    Roque Lias, MD 05/25/11 [phone]

## 2011-05-25 NOTE — ED Notes (Signed)
Pt wit c/o cough/congestion/aching/dizziness/nausea/fever onset yesterday

## 2011-05-29 ENCOUNTER — Encounter (HOSPITAL_COMMUNITY): Payer: Self-pay

## 2011-05-29 ENCOUNTER — Emergency Department (HOSPITAL_COMMUNITY)
Admission: EM | Admit: 2011-05-29 | Discharge: 2011-05-29 | Disposition: A | Discharge status: Home / Self Care | Payer: Medicaid Other | Source: Home / Self Care | Attending: Emergency Medicine | Admitting: Emergency Medicine

## 2011-05-29 DIAGNOSIS — J029 Acute pharyngitis, unspecified: Secondary | ICD-10-CM

## 2011-05-29 LAB — POCT INFECTIOUS MONO SCREEN: Mono Screen: NEGATIVE

## 2011-05-29 MED ORDER — PSEUDOEPHEDRINE-GUAIFENESIN ER 120-1200 MG PO TB12: 1.0000 | ORAL_TABLET | Freq: Two times a day (BID) | ORAL | Status: DC | PRN

## 2011-05-29 MED ORDER — FLUTICASONE PROPIONATE 50 MCG/ACT NA SUSP: 2.0000 | Freq: Every day | NASAL | Status: DC

## 2011-05-29 MED ORDER — LIDOCAINE VISCOUS 2 % MT SOLN: 10.0000 mL | OROMUCOSAL | Status: AC | PRN

## 2011-05-29 NOTE — ED Notes (Signed)
Pt c/o ST, body aches, fever, white spots on tonsils (left side) uvula midline

## 2011-05-29 NOTE — ED Provider Notes (Signed)
History     CSN: [id number]  Arrival date & time 05/29/11  1732   First MD Initiated Contact with Patient 05/29/11 1804      Chief Complaint  Patient presents with  . Sore Throat    (Consider location/radiation/quality/duration/timing/severity/associated sxs/prior treatment) HPI Comments: Pt c/o ST x 3 days. Fever tmax 100.4, pain with swallowing, noted exudates on tonsils L>R, painful cervical LN.  Also rhinorrhea, nonproductive cough, bodyaches. No rash, abd pain, wheeze, SOB, N/V, voice changes, Breathing difficulty, Drooling,Trismus. States has had flu like sx for 9 days. Pt seen here 4 days ago c/o cough, had neg CXR thought to have influenza like illness sent home with tramadol and tessalon. Pt states she has not needed the tramadol. Husband currently with PNA/influenza.    Patient is a 26 y.o. female presenting with pharyngitis.  Sore Throat Pertinent negatives include no abdominal pain and no shortness of breath.    Past Medical History  Diagnosis Date  . No pertinent past medical history   . Anemia     Past Surgical History  Procedure Date  . Vaginal polyps removed   . Vaginal poloyps   . Cesarean section 12/04/2010    Procedure: CESAREAN SECTION;  Surgeon: Oliver Pila;  Location: [facility];  Service: Gynecology;  Laterality: N/A;  . Cesarean section     Family History  Problem Relation Age of Onset  . Heart disease Maternal Grandmother   . Heart disease Maternal Grandfather   . Diabetes Paternal Grandfather   . Diabetes Paternal Grandmother     History  Substance Use Topics  . Smoking status: Never Smoker   . Smokeless tobacco: Not on file  . Alcohol Use: No    OB History    Grav Para Term Preterm Abortions TAB SAB Ect Mult Living   1 1 1              Review of Systems  Constitutional: Positive for fever and fatigue.  HENT: Positive for congestion, sore throat, rhinorrhea and trouble swallowing. Negative for ear pain, drooling, mouth sores,  voice change and sinus pressure.   Respiratory: Positive for cough. Negative for shortness of breath and wheezing.   Gastrointestinal: Negative for nausea, vomiting and abdominal pain.  Musculoskeletal: Positive for myalgias.  Skin: Negative for rash.  Neurological: Positive for weakness.    Allergies  Review of patient's allergies indicates no known allergies.  Home Medications   Current Outpatient Rx  Name Route Sig Dispense Refill  . BENZONATATE 200 MG PO CAPS Oral Take 1 capsule (200 mg total) by mouth 3 (three) times daily as needed for cough. 30 capsule 0  . FERROUS SULFATE 325 (65 FE) MG PO TABS Oral Take 325 mg by mouth 2 (two) times daily. For supplement    . FLUTICASONE PROPIONATE 50 MCG/ACT NA SUSP Nasal Place 2 sprays into the nose daily. 16 g 0  . IBUPROFEN 200 MG PO TABS Oral Take 800 mg by mouth every 6 (six) hours as needed.      Marland Kitchen LIDOCAINE VISCOUS 2 % MT SOLN Oral Take 10 mLs by mouth as needed for pain. Swish and spit. Do not swallow. 100 mL 0  . PRENATAL PLUS 27-1 MG PO TABS Oral Take 1 tablet by mouth daily. 30 each 12  . PSEUDOEPHEDRINE-GUAIFENESIN ER [phone] MG PO TB12 Oral Take 1 tablet by mouth 2 (two) times daily as needed (congestion). 20 each 0  . TRAMADOL HCL 50 MG PO TABS Oral Take 2  tablets (100 mg total) by mouth every 8 (eight) hours as needed for pain. 30 tablet 0    BP 139/79  Pulse 90  Temp(Src) 97.9 F (36.6 C) (Oral)  Resp 18  SpO2 99%  LMP 05/15/2011  Breastfeeding? No  Physical Exam  Nursing note and vitals reviewed. Constitutional: She is oriented to person, place, and time. She appears well-developed and well-nourished.  HENT:  Head: Normocephalic and atraumatic. No trismus in the jaw.  Right Ear: Tympanic membrane and ear canal normal.  Left Ear: Tympanic membrane and ear canal normal.  Nose: Mucosal edema and rhinorrhea present. No epistaxis. Right sinus exhibits no maxillary sinus tenderness and no frontal sinus tenderness. Left  sinus exhibits no frontal sinus tenderness.  Mouth/Throat: Uvula is midline and mucous membranes are normal. Posterior oropharyngeal erythema present. No oropharyngeal exudate, posterior oropharyngeal edema or tonsillar abscesses.       Erythematous tonsils with exudates b/l. minimal swelling.   Eyes: Conjunctivae and EOM are normal. Pupils are equal, round, and reactive to light.  Neck: Normal range of motion. Neck supple.       L>R  Cardiovascular: Normal rate, regular rhythm and normal heart sounds.   Pulmonary/Chest: Effort normal and breath sounds normal. No respiratory distress. She has no wheezes. She has no rales.  Abdominal: Soft. Bowel sounds are normal. She exhibits no distension. There is no tenderness. There is no rebound and no guarding.  Musculoskeletal: Normal range of motion.  Lymphadenopathy:    She has cervical adenopathy.  Neurological: She is alert and oriented to person, place, and time.  Skin: Skin is warm and dry. No rash noted.  Psychiatric: She has a normal mood and affect. Her behavior is normal. Judgment and thought content normal.    ED Course  Procedures (including critical care time)   Labs Reviewed  POCT RAPID STREP A ([facility] ONLY)  POCT INFECTIOUS MONO SCREEN   No results found. Results for orders placed during the hospital encounter of 05/29/11  POCT RAPID STREP A ([facility] ONLY)      Component Value Range   Streptococcus, Group A Screen (Direct) NEGATIVE  NEGATIVE   POCT INFECTIOUS MONO SCREEN      Component Value Range   Mono Screen NEGATIVE  NEGATIVE    Results for orders placed during the hospital encounter of 05/29/11  POCT RAPID STREP A ([facility] ONLY)      Component Value Range   Streptococcus, Group A Screen (Direct) NEGATIVE  NEGATIVE   POCT INFECTIOUS MONO SCREEN      Component Value Range   Mono Screen NEGATIVE  NEGATIVE       1. Pharyngitis     MDM  Previous chart, labs, imaging reviewed as noted in HPI. To  clarify, pt was not taking Z pack on last visit.  Checking RAS. If neg will check mono as has had viral like illness with this preceding ST. If this neg, will send home with supportive tx. AF here, airway widely patent.    On re-evaluation, pt comfortable, VSS, no complaints.   Discussed  lab results with patient & family. Emphasized importance of f/u. Pt agrees.    Luiz Blare, MD 05/29/11 2035

## 2011-05-30 ENCOUNTER — Emergency Department (HOSPITAL_COMMUNITY)
Admission: EM | Admit: 2011-05-30 | Discharge: 2011-05-30 | Disposition: A | Discharge status: Home / Self Care | Payer: Medicaid Other | Attending: Emergency Medicine | Admitting: Emergency Medicine

## 2011-05-30 ENCOUNTER — Encounter (HOSPITAL_COMMUNITY): Payer: Self-pay | Admitting: Emergency Medicine

## 2011-05-30 DIAGNOSIS — B9789 Other viral agents as the cause of diseases classified elsewhere: Secondary | ICD-10-CM | POA: Insufficient documentation

## 2011-05-30 DIAGNOSIS — J029 Acute pharyngitis, unspecified: Secondary | ICD-10-CM

## 2011-05-30 DIAGNOSIS — R599 Enlarged lymph nodes, unspecified: Secondary | ICD-10-CM | POA: Insufficient documentation

## 2011-05-30 DIAGNOSIS — B349 Viral infection, unspecified: Secondary | ICD-10-CM

## 2011-05-30 DIAGNOSIS — J3489 Other specified disorders of nose and nasal sinuses: Secondary | ICD-10-CM | POA: Insufficient documentation

## 2011-05-30 NOTE — ED Provider Notes (Signed)
Medical screening examination/treatment/procedure(s) were performed by non-physician practitioner and as supervising physician I was immediately available for consultation/collaboration.    Celene Kras, MD 05/30/11 [phone]

## 2011-05-30 NOTE — ED Provider Notes (Signed)
History     CSN: [id number]  Arrival date & time 05/30/11  [place]   First MD Initiated Contact with Patient 05/30/11 2121      Chief Complaint  Patient presents with  . Sore Throat    (Consider location/radiation/quality/duration/timing/severity/associated sxs/prior treatment) HPI Comments: Patient reports she initially had the flu, 10 days ago, was getting better, then began to have a sore throat.  Was seen at urgent care yesterday and had a negative strep and negative mono test.  States she was given viscous lidocaine for her throat but is unable to use it because it gags her.  States she has otherwise been trying to avoid taking pain medications and woke up today with worsening sore throat.  States she took ibuprofen and came here immediately.  States the ibuprofen worked well and her pain is now a 3/10 intensity.  States that she actually feels like she is getting better overall.  Continues to have nasal congestion.  Cough is improving.  Denies any recent fevers, any difficulty swallowing or breathing. Pt has tried no other medications or treatments for her symptoms.   Patient is a 26 y.o. female presenting with pharyngitis. The history is provided by the patient, medical records and a significant other.  Sore Throat Pertinent negatives include no abdominal pain, neck pain or vomiting.    Past Medical History  Diagnosis Date  . No pertinent past medical history   . Anemia     Past Surgical History  Procedure Date  . Vaginal polyps removed   . Vaginal poloyps   . Cesarean section 12/04/2010    Procedure: CESAREAN SECTION;  Surgeon: Oliver Pila;  Location: [facility];  Service: Gynecology;  Laterality: N/A;  . Cesarean section     Family History  Problem Relation Age of Onset  . Heart disease Maternal Grandmother   . Heart disease Maternal Grandfather   . Diabetes Paternal Grandfather   . Diabetes Paternal Grandmother     History  Substance Use Topics  . Smoking  status: Never Smoker   . Smokeless tobacco: Not on file  . Alcohol Use: No    OB History    Grav Para Term Preterm Abortions TAB SAB Ect Mult Living   1 1 1              Review of Systems  HENT: Negative for neck pain and neck stiffness.   Respiratory: Negative for shortness of breath.   Gastrointestinal: Negative for vomiting, abdominal pain and diarrhea.  All other systems reviewed and are negative.    Allergies  Review of patient's allergies indicates no known allergies.  Home Medications   Current Outpatient Rx  Name Route Sig Dispense Refill  . BENZONATATE 200 MG PO CAPS Oral Take 1 capsule (200 mg total) by mouth 3 (three) times daily as needed for cough. 30 capsule 0  . FERROUS SULFATE 325 (65 FE) MG PO TABS Oral Take 325 mg by mouth 2 (two) times daily. For supplement    . FLUTICASONE PROPIONATE 50 MCG/ACT NA SUSP Nasal Place 2 sprays into the nose daily. 16 g 0  . IBUPROFEN 200 MG PO TABS Oral Take 800 mg by mouth every 6 (six) hours as needed.      Marland Kitchen LIDOCAINE VISCOUS 2 % MT SOLN Oral Take 10 mLs by mouth as needed for pain. Swish and spit. Do not swallow. 100 mL 0  . PRENATAL PLUS 27-1 MG PO TABS Oral Take 1 tablet by mouth daily.  30 each 12  . PSEUDOEPHEDRINE-GUAIFENESIN ER [phone] MG PO TB12 Oral Take 1 tablet by mouth 2 (two) times daily as needed (congestion). 20 each 0  . TRAMADOL HCL 50 MG PO TABS Oral Take 2 tablets (100 mg total) by mouth every 8 (eight) hours as needed for pain. 30 tablet 0    BP 112/77  Pulse 94  Temp(Src) 98.5 F (36.9 C) (Oral)  Resp 16  SpO2 99%  LMP 05/15/2011  Physical Exam  Nursing note and vitals reviewed. Constitutional: She is oriented to person, place, and time. She appears well-developed and well-nourished.  HENT:  Head: Normocephalic and atraumatic.  Nose: Mucosal edema and rhinorrhea present.  Mouth/Throat: Uvula is midline and mucous membranes are normal. Posterior oropharyngeal erythema present. No oropharyngeal  exudate, posterior oropharyngeal edema or tonsillar abscesses.       Pharynx mildly erythematous without edema or exudate.  Widely patent.    Neck: Neck supple.  Cardiovascular: Normal rate, regular rhythm and normal heart sounds.   Pulmonary/Chest: Effort normal and breath sounds normal. No stridor. No respiratory distress. She has no wheezes. She has no rales. She exhibits no tenderness.  Lymphadenopathy:    She has cervical adenopathy.  Neurological: She is alert and oriented to person, place, and time.    ED Course  Procedures (including critical care time)  Labs Reviewed - No data to display No results found.   1. Viral infection   2. Sore throat       MDM  Patient seen yesterday at urgent care returns today with complaints of same - throat was sore at home though patient was trying to avoid taking medications for her symptoms because she doesn't like medicine.  After taking ibuprofen, patient feeling much better.  Negative strep test yesterday.  Pt is afebrile, +anterior cervical lymphadenopathy, no tonsilar exudates.  Pt is nontoxic appearing, no meningismus.  Discussed symptomatic treatment for viral illness with patient.  Pt verbalizes understanding.          Dillard Cannon [place], [place] 05/30/11 2147

## 2011-05-30 NOTE — ED Notes (Signed)
Pt states she was seen at the [facility] yesterday, s/s started on Monday, pt c/o sore throat, difficulty swallowing, uvula in the mid line with minimal swelling, noted redness but no white spots on the tonsils at this time. Pt given Rx of Viscous Lidocaine, denies fever, states that she also getting over the cold

## 2011-07-03 ENCOUNTER — Emergency Department (HOSPITAL_COMMUNITY): Payer: Medicaid Other

## 2011-07-03 ENCOUNTER — Encounter (HOSPITAL_COMMUNITY): Payer: Self-pay | Admitting: Emergency Medicine

## 2011-07-03 ENCOUNTER — Emergency Department (HOSPITAL_COMMUNITY)
Admission: EM | Admit: 2011-07-03 | Discharge: 2011-07-04 | Disposition: A | Discharge status: Home / Self Care | Payer: Medicaid Other | Attending: Emergency Medicine | Admitting: Emergency Medicine

## 2011-07-03 DIAGNOSIS — N760 Acute vaginitis: Secondary | ICD-10-CM | POA: Insufficient documentation

## 2011-07-03 DIAGNOSIS — R1013 Epigastric pain: Secondary | ICD-10-CM | POA: Insufficient documentation

## 2011-07-03 LAB — DIFFERENTIAL
Eosinophils Absolute: 0.1 10*3/uL (ref 0.0–0.7)
Lymphocytes Relative: 29 % (ref 12–46)
Lymphs Abs: 1.7 10*3/uL (ref 0.7–4.0)
Monocytes Relative: 8 % (ref 3–12)
Neutrophils Relative %: 62 % (ref 43–77)

## 2011-07-03 LAB — URINALYSIS, ROUTINE W REFLEX MICROSCOPIC
Bilirubin Urine: NEGATIVE
Glucose, UA: NEGATIVE mg/dL
Ketones, ur: NEGATIVE mg/dL
Leukocytes, UA: NEGATIVE
pH: 7 (ref 5.0–8.0)

## 2011-07-03 LAB — CBC
Hemoglobin: 12.4 g/dL (ref 12.0–15.0)
MCH: 28.3 pg (ref 26.0–34.0)
RBC: 4.38 MIL/uL (ref 3.87–5.11)

## 2011-07-03 MED ORDER — SODIUM CHLORIDE 0.9 % IV BOLUS (SEPSIS)
1000.0000 mL | Freq: Once | INTRAVENOUS | Status: AC
  Administered 2011-07-03: 1000 mL via INTRAVENOUS

## 2011-07-03 MED ORDER — FENTANYL CITRATE 0.05 MG/ML IJ SOLN
50.0000 ug | Freq: Once | INTRAMUSCULAR | Status: AC
  Administered 2011-07-03: 50 ug via INTRAVENOUS
  Filled 2011-07-03: qty 2

## 2011-07-03 MED ORDER — ONDANSETRON HCL 4 MG/2ML IJ SOLN
4.0000 mg | Freq: Once | INTRAMUSCULAR | Status: AC
Start: 2011-07-03 — End: 2011-07-03
  Administered 2011-07-03: 4 mg via INTRAVENOUS
  Filled 2011-07-03: qty 2

## 2011-07-03 NOTE — ED Notes (Addendum)
Patient also c/o itching and burning to perineal area. States itching and burning is worse after wiping after urination. Brigitte, PA notified.

## 2011-07-03 NOTE — ED Notes (Signed)
Pt alert, nad, c/o epigastric pain, onset this afternoon, denies changes in bowel or bladder habits, denies n/v, resp even unlabored, pain described as sharp non radiating

## 2011-07-04 LAB — COMPREHENSIVE METABOLIC PANEL
Alkaline Phosphatase: 70 U/L (ref 39–117)
BUN: 16 mg/dL (ref 6–23)
CO2: 25 mEq/L (ref 19–32)
Chloride: 100 mEq/L (ref 96–112)
GFR calc Af Amer: >90 mL/min (ref >90)
GFR calc non Af Amer: >90 mL/min (ref >90)
Glucose, Bld: 106 mg/dL — ABNORMAL HIGH (ref 70–99)
Potassium: 4 mEq/L (ref 3.5–5.1)
Total Bilirubin: 0.2 mg/dL — ABNORMAL LOW (ref 0.3–1.2)
Total Protein: 7.6 g/dL (ref 6.0–8.3)

## 2011-07-04 LAB — LIPASE, BLOOD: Lipase: 33 U/L (ref 11–59)

## 2011-07-04 LAB — GC/CHLAMYDIA PROBE AMP, GENITAL: Chlamydia, DNA Probe: NEGATIVE

## 2011-07-04 MED ORDER — PANTOPRAZOLE SODIUM 20 MG PO TBEC: 20.0000 mg | DELAYED_RELEASE_TABLET | Freq: Every day | ORAL | Status: DC

## 2011-07-04 MED ORDER — METRONIDAZOLE 1 % EX CREA: TOPICAL_CREAM | Freq: Every day | CUTANEOUS | Status: DC

## 2011-07-04 MED ORDER — TRAMADOL HCL 50 MG PO TABS: 50.0000 mg | ORAL_TABLET | Freq: Four times a day (QID) | ORAL | Status: AC | PRN

## 2011-07-04 NOTE — Discharge Instructions (Signed)
Abdominal Pain Abdominal pain can be caused by many things. Your caregiver decides the seriousness of your pain by an examination and possibly blood tests and X-rays. Many cases can be observed and treated at home. Most abdominal pain is not caused by a disease and will probably improve without treatment. However, in many cases, more time must pass before a clear cause of the pain can be found. Before that point, it may not be known if you need more testing, or if hospitalization or surgery is needed. HOME CARE INSTRUCTIONS   Do not take laxatives unless directed by your caregiver.   Take pain medicine only as directed by your caregiver.   Only take over-the-counter or prescription medicines for pain, discomfort, or fever as directed by your caregiver.   Try a clear liquid diet (broth, tea, or water) for as long as directed by your caregiver. Slowly move to a bland diet as tolerated.  SEEK IMMEDIATE MEDICAL CARE IF:   The pain does not go away.   You have a fever.   You keep throwing up (vomiting).   The pain is felt only in portions of the abdomen. Pain in the right side could possibly be appendicitis. In an adult, pain in the left lower portion of the abdomen could be colitis or diverticulitis.   You pass bloody or black tarry stools.  MAKE SURE YOU:   Understand these instructions.   Will watch your condition.   Will get help right away if you are not doing well or get worse.  Document Released: 02/13/2005 Document Revised: 01/16/2011 Document Reviewed: 12/23/2007 [facility] Patient Information 2012 [place], [place].Vaginitis Vaginitis in a soreness, swelling and redness (inflammation) of the vagina and vulva. This is not a sexually transmitted infection.  CAUSES  Yeast vaginitis is caused by yeast (candida) that is normally found in your vagina. With a yeast infection, the candida has over grown in number to a point that upsets the chemical balance. SYMPTOMS   White thick vaginal  discharge.   Swelling, itching, redness and irritation of the vagina and possibly the lips of the vagina (vulva).   Burning or painful urination.   Painful intercourse.  HOME CARE INSTRUCTIONS   Finish all medication as prescribed.   Do not have sex until treatment is completed or instructed by your healthcare giver.   Take warm sitz baths.   Do not douche.   Do not use tampons, especially scented ones.   Wear cotton underwear.   Avoid tight pants and panty hose.   Tell your sexual partner that you have a yeast infection. They should go to their caregiver if they have symptoms such as mild rash or itching.   Your sexual partner should be treated if your infection is difficult to eliminate.   Practice safer sex. Use condoms.   Some vaginal medications cause latex condoms to fail. Ask your caregiver this.  SEEK MEDICAL CARE IF:   You develop a fever.   The infection is getting worse after 2 days of treatment.   The infection is not getting better after 3 days of treatment.   You develop blisters in or around your vagina.   You develop vaginal bleeding, and it is not your menstrual period.   You have pain when you urinate.   You develop intestinal problems.   You have pain with sexual intercourse.  Document Released: 06/13/2004 Document Revised: 01/16/2011 Document Reviewed: 01/19/2009 [facility] Patient Information 2012 [place], [place].

## 2011-07-04 NOTE — ED Notes (Signed)
[place], MD at bedside. 

## 2011-07-04 NOTE — ED Notes (Signed)
Returned from ultrasound.

## 2011-07-04 NOTE — ED Provider Notes (Signed)
History     CSN: [id number]  Arrival date & time 07/03/11  2119   First MD Initiated Contact with Patient 07/03/11 2313     HPI Patient reports at 12 PM had a hamburger. Reports at 2 PM developed sudden onset epigastric pain. Reports pain radiates to her right upper quadrant. States associated with nausea. Denies urinary symptoms, vaginal symptoms, diarrhea, fever or back pain. Patient is a 26 y.o. female presenting with abdominal pain. The history is provided by the patient.  Abdominal Pain The primary symptoms of the illness include abdominal pain and nausea. The primary symptoms of the illness do not include fever, shortness of breath, vomiting, diarrhea, dysuria or vaginal discharge. The current episode started 6 to 12 hours ago. The onset of the illness was sudden. The problem has not changed since onset. The abdominal pain is located in the RUQ and epigastric region. The abdominal pain does not radiate. The severity of the abdominal pain is 8/10. The abdominal pain is relieved by nothing.  The patient states that she believes she is currently not pregnant. The patient has not had a change in bowel habit. Symptoms associated with the illness do not include chills, constipation, urgency, hematuria, frequency or back pain.    Past Medical History  Diagnosis Date  . No pertinent past medical history   . Anemia     Past Surgical History  Procedure Date  . Vaginal polyps removed   . Vaginal poloyps   . Cesarean section 12/04/2010    Procedure: CESAREAN SECTION;  Surgeon: Oliver Pila;  Location: [facility];  Service: Gynecology;  Laterality: N/A;  . Cesarean section     Family History  Problem Relation Age of Onset  . Heart disease Maternal Grandmother   . Heart disease Maternal Grandfather   . Diabetes Paternal Grandfather   . Diabetes Paternal Grandmother     History  Substance Use Topics  . Smoking status: Never Smoker   . Smokeless tobacco: Not on file  . Alcohol Use:  No    OB History    Grav Para Term Preterm Abortions TAB SAB Ect Mult Living   1 1 1              Review of Systems  Constitutional: Negative for fever and chills.  Respiratory: Negative for shortness of breath.   Cardiovascular: Negative for chest pain.  Gastrointestinal: Positive for nausea and abdominal pain. Negative for vomiting, diarrhea and constipation.  Genitourinary: Negative for dysuria, urgency, frequency, hematuria, flank pain, vaginal discharge and vaginal pain.  Musculoskeletal: Negative for back pain.  All other systems reviewed and are negative.    Allergies  Review of patient's allergies indicates no known allergies.  Home Medications  No current outpatient prescriptions on file.  BP 145/89  Pulse 105  Temp 98 F (36.7 C)  Resp 16  Wt 202 lb (91.627 kg)  SpO2 99%  Physical Exam  Vitals reviewed. Constitutional: She is oriented to person, place, and time. Vital signs are normal. She appears well-developed and well-nourished.  HENT:  Head: Normocephalic and atraumatic.  Eyes: Conjunctivae are normal. Pupils are equal, round, and reactive to light.  Neck: Normal range of motion. Neck supple.  Cardiovascular: Normal rate, regular rhythm and normal heart sounds.  Exam reveals no friction rub.   No murmur heard. Pulmonary/Chest: Effort normal and breath sounds normal. She has no wheezes. She has no rhonchi. She has no rales. She exhibits no tenderness.  Abdominal: Soft. Bowel sounds are  normal. She exhibits no distension and no mass. There is no hepatosplenomegaly. There is tenderness in the right upper quadrant and epigastric area. There is no rigidity, no rebound, no guarding, no CVA tenderness, no tenderness at McBurney's point and negative Murphy's sign.  Musculoskeletal: Normal range of motion.  Neurological: She is alert and oriented to person, place, and time. Coordination normal.  Skin: Skin is warm and dry. No rash noted. No erythema. No pallor.     ED Course  Procedures  Results for orders placed during the hospital encounter of 07/03/11  URINALYSIS, ROUTINE W REFLEX MICROSCOPIC      Component Value Range   Color, Urine YELLOW  YELLOW    APPearance CLEAR  CLEAR    Specific Gravity, Urine 1.024  1.005 - 1.030    pH 7.0  5.0 - 8.0    Glucose, UA NEGATIVE  NEGATIVE (mg/dL)   Hgb urine dipstick NEGATIVE  NEGATIVE    Bilirubin Urine NEGATIVE  NEGATIVE    Ketones, ur NEGATIVE  NEGATIVE (mg/dL)   Protein, ur NEGATIVE  NEGATIVE (mg/dL)   Urobilinogen, UA 0.2  0.0 - 1.0 (mg/dL)   Nitrite NEGATIVE  NEGATIVE    Leukocytes, UA NEGATIVE  NEGATIVE   POCT PREGNANCY, URINE      Component Value Range   Preg Test, Ur NEGATIVE  NEGATIVE   CBC      Component Value Range   WBC 5.9  4.0 - 10.5 (K/uL)   RBC 4.38  3.87 - 5.11 (MIL/uL)   Hemoglobin 12.4  12.0 - 15.0 (g/dL)   HCT 57.8  46.9 - 62.9 (%)   MCV 84.9  78.0 - 100.0 (fL)   MCH 28.3  26.0 - 34.0 (pg)   MCHC 33.3  30.0 - 36.0 (g/dL)   RDW 52.8  41.3 - 24.4 (%)   Platelets 243  150 - 400 (K/uL)  DIFFERENTIAL      Component Value Range   Neutrophils Relative 62  43 - 77 (%)   Neutro Abs 3.6  1.7 - 7.7 (K/uL)   Lymphocytes Relative 29  12 - 46 (%)   Lymphs Abs 1.7  0.7 - 4.0 (K/uL)   Monocytes Relative 8  3 - 12 (%)   Monocytes Absolute 0.5  0.1 - 1.0 (K/uL)   Eosinophils Relative 1  0 - 5 (%)   Eosinophils Absolute 0.1  0.0 - 0.7 (K/uL)   Basophils Relative 0  0 - 1 (%)   Basophils Absolute 0.0  0.0 - 0.1 (K/uL)  COMPREHENSIVE METABOLIC PANEL      Component Value Range   Sodium 136  135 - 145 (mEq/L)   Potassium 4.0  3.5 - 5.1 (mEq/L)   Chloride 100  96 - 112 (mEq/L)   CO2 25  19 - 32 (mEq/L)   Glucose, Bld 106 (*) 70 - 99 (mg/dL)   BUN 16  6 - 23 (mg/dL)   Creatinine, Ser 0.10  0.50 - 1.10 (mg/dL)   Calcium 9.4  8.4 - 27.2 (mg/dL)   Total Protein 7.6  6.0 - 8.3 (g/dL)   Albumin 3.7  3.5 - 5.2 (g/dL)   AST 24  0 - 37 (U/L)   ALT 22  0 - 35 (U/L)   Alkaline Phosphatase  70  39 - 117 (U/L)   Total Bilirubin 0.2 (*) 0.3 - 1.2 (mg/dL)   GFR calc non Af Amer >90  >90 (mL/min)   GFR calc Af Amer >90  >90 (mL/min)  LIPASE,  BLOOD      Component Value Range   Lipase 33  11 - 59 (U/L)   US Abdomen Complete  07/04/2011  *RADIOLOGY REPORT*  Clinical Data:  Right upper quadrant pain  COMPLETE ABDOMINAL ULTRASOUND  Comparison:  None.  Findings:  Gallbladder:  No gallstones, gallbladder wall thickening, or pericholecystic fluid.  Common bile duct:  Normal caliber, measuring 3 mm diameter.  Liver:  No focal lesion identified.  Within normal limits in parenchymal echogenicity.  IVC:  Appears normal.  Pancreas:  Incomplete visualization of the pancreas.  No focal abnormality demonstrated in the visualized portion.  Spleen:  Spleen length measures  11.4 cm.  Normal homogeneous parenchymal echotexture.  Right Kidney:  Right kidney measures 11.9 cm length.  No hydronephrosis.  Left Kidney:  Left kidney measures 11.4 cm length.  No hydronephrosis.  Abdominal aorta:  No aneurysm identified.  IMPRESSION: Negative abdominal ultrasound.  Original Report Authenticated By: Marlon Pel, M.D.     MDM  1:37 AM  Patiently reports vulvar irritation for several months. Denies vaginal discharge. After doing pelvic exam discussed with patient that we will provide her with vaginitis cream however patient does report a history of eczema. Suspect likely having eczema flare. Does reports rash on her head as well. Will refer to dermatologist. Advised if no improvement on the cream to followup with the dermatologist. Patient agrees with plan and is ready for discharge.         Thomasene Lot, PA-C 07/04/11 0236  Thomasene Lot, PA-C 07/06/11 1346

## 2011-07-04 NOTE — ED Notes (Signed)
Patient transported to Ultrasound 

## 2011-07-06 NOTE — ED Provider Notes (Signed)
Medical screening examination/treatment/procedure(s) were performed by non-physician practitioner and as supervising physician I was immediately available for consultation/collaboration.  Ethelda Chick, MD 07/06/11 [phone]

## 2011-12-16 ENCOUNTER — Encounter (HOSPITAL_COMMUNITY): Payer: Self-pay | Admitting: *Deleted

## 2011-12-16 ENCOUNTER — Emergency Department (INDEPENDENT_AMBULATORY_CARE_PROVIDER_SITE_OTHER)
Admission: EM | Admit: 2011-12-16 | Discharge: 2011-12-16 | Disposition: A | Discharge status: Home / Self Care | Payer: Self-pay | Source: Home / Self Care | Attending: Emergency Medicine | Admitting: Emergency Medicine

## 2011-12-16 DIAGNOSIS — M549 Dorsalgia, unspecified: Secondary | ICD-10-CM

## 2011-12-16 MED ORDER — MELOXICAM 7.5 MG PO TABS: 7.5000 mg | ORAL_TABLET | Freq: Every day | ORAL | Status: DC

## 2011-12-16 NOTE — ED Notes (Signed)
Pt  Ambulated  To  Room   With  Steady  Fluid  Gait  -  Pt  Reports   Low  Back pain off  And  On  For  1  Year  Worse  Over  The  Last  Week      -  denys  specefic injury       Pain worse  On standing  And  Lifting  denys  Any  Urinary  Symptoms

## 2011-12-16 NOTE — ED Provider Notes (Addendum)
History     CSN: [id number]  Arrival date & time 12/16/11  1620   First MD Initiated Contact with Patient 12/16/11 1719      Chief Complaint  Patient presents with  . Back Pain    (Consider location/radiation/quality/duration/timing/severity/associated sxs/prior treatment) Patient is a 26 y.o. female presenting with back pain. The history is provided by the patient and the spouse.  Back Pain  This is a chronic problem. The current episode started more than 1 week ago. The pain is associated with no known injury. Pertinent negatives include no fever, no numbness, no weight loss, no abdominal swelling, no bowel incontinence, no dysuria, no paresthesias, no paresis, no tingling and no weakness. She has tried nothing for the symptoms. The treatment provided mild relief.    Past Medical History  Diagnosis Date  . No pertinent past medical history   . Anemia     Past Surgical History  Procedure Date  . Vaginal polyps removed   . Vaginal poloyps   . Cesarean section 12/04/2010    Procedure: CESAREAN SECTION;  Surgeon: Oliver Pila;  Location: [facility];  Service: Gynecology;  Laterality: N/A;  . Cesarean section     Family History  Problem Relation Age of Onset  . Heart disease Maternal Grandmother   . Heart disease Maternal Grandfather   . Diabetes Paternal Grandfather   . Diabetes Paternal Grandmother     History  Substance Use Topics  . Smoking status: Never Smoker   . Smokeless tobacco: Not on file  . Alcohol Use: No    OB History    Grav Para Term Preterm Abortions TAB SAB Ect Mult Living   1 1 1              Review of Systems  Constitutional: Negative for fever, chills, weight loss, activity change, appetite change, fatigue and unexpected weight change.  Gastrointestinal: Negative for bowel incontinence.  Genitourinary: Negative for dysuria, flank pain and enuresis.  Musculoskeletal: Positive for back pain. Negative for myalgias, joint swelling and gait  problem.  Skin: Negative for color change, pallor and rash.  Neurological: Negative for dizziness, tingling, weakness, numbness and paresthesias.  All other systems reviewed and are negative.    Allergies  Review of patient's allergies indicates no known allergies.  Home Medications   Current Outpatient Rx  Name Route Sig Dispense Refill  . MELOXICAM 7.5 MG PO TABS Oral Take 1 tablet (7.5 mg total) by mouth daily. 14 tablet 0  . METRONIDAZOLE 1 % EX CREA Topical Apply topically daily. 60 g 0  . PANTOPRAZOLE SODIUM 20 MG PO TBEC Oral Take 1 tablet (20 mg total) by mouth daily. 30 tablet 0    BP 134/86  Pulse 92  Temp 98.1 F (36.7 C) (Oral)  Resp 20  SpO2 100%  LMP 12/16/2011  Physical Exam  Nursing note and vitals reviewed. Constitutional: Vital signs are normal. She appears well-developed and well-nourished.  Non-toxic appearance. She does not have a sickly appearance. No distress.  HENT:  Head: Normocephalic.  Eyes: Conjunctivae are normal. Right eye exhibits no discharge.  Neck: No JVD present. No tracheal deviation present. No thyromegaly present.  Pulmonary/Chest: No respiratory distress. She has no wheezes.  Abdominal: Soft.  Musculoskeletal:       Lumbar back: She exhibits decreased range of motion, tenderness and pain. She exhibits no bony tenderness, no edema, no deformity, no laceration and no spasm.       Back:  Neurological: She is  alert.  Skin: Skin is warm.    ED Course  Procedures (including critical care time)  Labs Reviewed - No data to display No results found.   1. Back pain       MDM   Intermittent chronic back pain. Patient has a normal neural muscular exam and patient has no constitutional symptoms. She works for long periods of time standing in place at her job doesn't help. She does not exercise frequently and takes Motrin or Tylenol for discomfort. This last episode started last week. I have otherwise patient to exercise her lower  back once this current episode improves have prescribed a cycle of meloxicam for 2 weeks and discuss what symptoms will warrant further evaluation in the emergency department and what symptoms or persistent symptoms will require further evaluation with an orthopedic provider. Patient understands and acknowledged.       Jimmie Molly, MD 12/16/11 1830  Jimmie Molly, MD 12/16/11 Paulo Fruit

## 2012-04-25 ENCOUNTER — Emergency Department (HOSPITAL_COMMUNITY): Payer: Medicaid Other

## 2012-04-25 ENCOUNTER — Encounter (HOSPITAL_COMMUNITY): Payer: Self-pay | Admitting: *Deleted

## 2012-04-25 ENCOUNTER — Emergency Department (HOSPITAL_COMMUNITY)
Admission: EM | Admit: 2012-04-25 | Discharge: 2012-04-25 | Disposition: A | Discharge status: Home / Self Care | Payer: Medicaid Other | Attending: Emergency Medicine | Admitting: Emergency Medicine

## 2012-04-25 DIAGNOSIS — R300 Dysuria: Secondary | ICD-10-CM | POA: Insufficient documentation

## 2012-04-25 DIAGNOSIS — R109 Unspecified abdominal pain: Secondary | ICD-10-CM

## 2012-04-25 DIAGNOSIS — Z862 Personal history of diseases of the blood and blood-forming organs and certain disorders involving the immune mechanism: Secondary | ICD-10-CM | POA: Insufficient documentation

## 2012-04-25 DIAGNOSIS — R197 Diarrhea, unspecified: Secondary | ICD-10-CM | POA: Insufficient documentation

## 2012-04-25 DIAGNOSIS — R112 Nausea with vomiting, unspecified: Secondary | ICD-10-CM | POA: Insufficient documentation

## 2012-04-25 DIAGNOSIS — Z3202 Encounter for pregnancy test, result negative: Secondary | ICD-10-CM | POA: Insufficient documentation

## 2012-04-25 LAB — CBC WITH DIFFERENTIAL/PLATELET
Eosinophils Relative: 0 % (ref 0–5)
HCT: 39.9 % (ref 36.0–46.0)
Hemoglobin: 13.7 g/dL (ref 12.0–15.0)
Lymphocytes Relative: 3 % — ABNORMAL LOW (ref 12–46)
Lymphs Abs: 0.3 10*3/uL — ABNORMAL LOW (ref 0.7–4.0)
MCV: 85.8 fL (ref 78.0–100.0)
Monocytes Absolute: 0.3 10*3/uL (ref 0.1–1.0)
Neutro Abs: 8.4 10*3/uL — ABNORMAL HIGH (ref 1.7–7.7)
RBC: 4.65 MIL/uL (ref 3.87–5.11)
WBC: 8.9 10*3/uL (ref 4.0–10.5)

## 2012-04-25 LAB — URINALYSIS, ROUTINE W REFLEX MICROSCOPIC
Bilirubin Urine: NEGATIVE
Glucose, UA: NEGATIVE mg/dL
Hgb urine dipstick: NEGATIVE
Ketones, ur: NEGATIVE mg/dL
Protein, ur: NEGATIVE mg/dL
Urobilinogen, UA: 1 mg/dL (ref 0.0–1.0)

## 2012-04-25 LAB — BASIC METABOLIC PANEL
CO2: 22 mEq/L (ref 19–32)
Calcium: 9.1 mg/dL (ref 8.4–10.5)
Chloride: 98 mEq/L (ref 96–112)
Creatinine, Ser: 0.71 mg/dL (ref 0.50–1.10)
Glucose, Bld: 107 mg/dL — ABNORMAL HIGH (ref 70–99)

## 2012-04-25 LAB — HEPATIC FUNCTION PANEL
ALT: 21 U/L (ref 0–35)
AST: 20 U/L (ref 0–37)
Alkaline Phosphatase: 89 U/L (ref 39–117)
Bilirubin, Direct: <0.1 mg/dL (ref 0.0–0.3)
Total Bilirubin: 0.4 mg/dL (ref 0.3–1.2)

## 2012-04-25 LAB — WET PREP, GENITAL
Clue Cells Wet Prep HPF POC: NONE SEEN
Trich, Wet Prep: NONE SEEN

## 2012-04-25 LAB — PREGNANCY, URINE: Preg Test, Ur: NEGATIVE

## 2012-04-25 LAB — LIPASE, BLOOD: Lipase: 30 U/L (ref 11–59)

## 2012-04-25 MED ORDER — HYDROMORPHONE HCL PF 1 MG/ML IJ SOLN
1.0000 mg | Freq: Once | INTRAMUSCULAR | Status: AC
  Administered 2012-04-25: 1 mg via INTRAVENOUS
  Filled 2012-04-25: qty 1

## 2012-04-25 MED ORDER — FAMOTIDINE IN NACL 20-0.9 MG/50ML-% IV SOLN
20.0000 mg | Freq: Once | INTRAVENOUS | Status: AC
  Administered 2012-04-25: 20 mg via INTRAVENOUS
  Filled 2012-04-25: qty 50

## 2012-04-25 MED ORDER — ONDANSETRON HCL 4 MG/2ML IJ SOLN
4.0000 mg | Freq: Once | INTRAMUSCULAR | Status: AC
  Administered 2012-04-25: 4 mg via INTRAVENOUS
  Filled 2012-04-25: qty 2

## 2012-04-25 MED ORDER — HYDROCODONE-ACETAMINOPHEN 5-325 MG PO TABS: 1.0000 | ORAL_TABLET | ORAL | Status: DC | PRN

## 2012-04-25 MED ORDER — SODIUM CHLORIDE 0.9 % IV BOLUS (SEPSIS)
1000.0000 mL | Freq: Once | INTRAVENOUS | Status: AC
  Administered 2012-04-25: 1000 mL via INTRAVENOUS

## 2012-04-25 MED ORDER — HYDROCODONE-ACETAMINOPHEN 5-325 MG PO TABS: 1.0000 | ORAL_TABLET | Freq: Once | ORAL | Status: DC

## 2012-04-25 MED ORDER — IBUPROFEN 600 MG PO TABS: 600.0000 mg | ORAL_TABLET | Freq: Four times a day (QID) | ORAL | Status: DC | PRN

## 2012-04-25 MED ORDER — ONDANSETRON 8 MG PO TBDP: 8.0000 mg | ORAL_TABLET | Freq: Three times a day (TID) | ORAL | Status: DC | PRN

## 2012-04-25 MED ORDER — ONDANSETRON HCL 4 MG/2ML IJ SOLN
4.0000 mg | Freq: Once | INTRAMUSCULAR | Status: AC
  Administered 2012-04-25: 4 mg via INTRAVENOUS

## 2012-04-25 MED ORDER — SODIUM CHLORIDE 0.9 % IV SOLN
Freq: Once | INTRAVENOUS | Status: AC
  Administered 2012-04-25: 17:00:00 via INTRAVENOUS

## 2012-04-25 MED ORDER — IOHEXOL 300 MG/ML  SOLN
100.0000 mL | Freq: Once | INTRAMUSCULAR | Status: AC | PRN
  Administered 2012-04-25: 100 mL via INTRAVENOUS

## 2012-04-25 MED ORDER — ONDANSETRON HCL 4 MG PO TABS: 4.0000 mg | ORAL_TABLET | Freq: Once | ORAL | Status: DC

## 2012-04-25 NOTE — ED Notes (Signed)
MD at bedside. 

## 2012-04-25 NOTE — ED Notes (Signed)
Patient from home with c/o nausea/ vomiting/ diarrhea and epigastric pain. Patient sts pain began this morning, sts yellow colored vomit. Sts her epigastric pain is constant cramping and stabbing pain. Sts pain also in her right flank. While in triage patient sts pain was so great that she fell to her knees, patient assisted to the chair by nursing staff. No vomiting at present time. Skin pwd.

## 2012-04-25 NOTE — ED Provider Notes (Addendum)
History     CSN: [id number]  Arrival date & time 04/25/12  1428   First MD Initiated Contact with Patient 04/25/12 1522      Chief Complaint  Patient presents with  . Abdominal Pain    (Consider location/radiation/quality/duration/timing/severity/associated sxs/prior treatment) HPI Comments: 26 y/o comes in with cc of abd pain. Pt has hx of c-section, no other abd / pelvic surgeries, or pathology. Pt reports that she woke up feeling nauseated, and has had about 5 episodes of emesis, now bilious. Pt also has loose BM. She developed abdominal pain later in the day, it is diffuse, but worst in the epigastrium. The pain is non radiating. She also c/o mild dysuria, no hx of pyelonephritis and no hx of renal stones. Pt is on her period now.   Patient is a 25 y.o. female presenting with abdominal pain. The history is provided by the patient.  Abdominal Pain The primary symptoms of the illness include abdominal pain, nausea, vomiting, diarrhea and dysuria. The primary symptoms of the illness do not include fever or shortness of breath.  The dysuria is not associated with hematuria.  Symptoms associated with the illness do not include constipation or hematuria.    Past Medical History  Diagnosis Date  . No pertinent past medical history   . Anemia     Past Surgical History  Procedure Date  . Vaginal polyps removed   . Vaginal poloyps   . Cesarean section 12/04/2010    Procedure: CESAREAN SECTION;  Surgeon: Oliver Pila;  Location: [facility];  Service: Gynecology;  Laterality: N/A;  . Cesarean section     Family History  Problem Relation Age of Onset  . Heart disease Maternal Grandmother   . Heart disease Maternal Grandfather   . Diabetes Paternal Grandfather   . Diabetes Paternal Grandmother     History  Substance Use Topics  . Smoking status: Never Smoker   . Smokeless tobacco: Not on file  . Alcohol Use: No    OB History    Grav Para Term Preterm Abortions TAB  SAB Ect Mult Living   1 1 1              Review of Systems  Constitutional: Negative for fever and activity change.  HENT: Negative for facial swelling and neck pain.   Respiratory: Negative for cough, shortness of breath and wheezing.   Cardiovascular: Negative for chest pain.  Gastrointestinal: Positive for nausea, vomiting, abdominal pain and diarrhea. Negative for constipation, blood in stool and abdominal distention.  Genitourinary: Positive for dysuria. Negative for hematuria and difficulty urinating.  Skin: Negative for color change.  Neurological: Negative for speech difficulty.  Hematological: Does not bruise/bleed easily.  Psychiatric/Behavioral: Negative for confusion.    Allergies  Review of patient's allergies indicates no known allergies.  Home Medications   Current Outpatient Rx  Name  Route  Sig  Dispense  Refill  . IBUPROFEN 200 MG PO TABS   Oral   Take 800 mg by mouth every 6 (six) hours as needed. pain           BP 138/71  Pulse 120  Temp 98.5 F (36.9 C) (Oral)  Resp 22  SpO2 100%  Physical Exam  Nursing note and vitals reviewed. Constitutional: She is oriented to person, place, and time. She appears well-developed.  HENT:  Head: Normocephalic and atraumatic.  Eyes: Conjunctivae normal and EOM are normal. Pupils are equal, round, and reactive to light.  Neck: Normal range  of motion. Neck supple.  Cardiovascular: Normal rate, regular rhythm, normal heart sounds and intact distal pulses.   No murmur heard. Pulmonary/Chest: Effort normal and breath sounds normal. No respiratory distress. She has no wheezes.  Abdominal: Soft. Bowel sounds are normal. She exhibits no distension. There is tenderness. There is guarding. There is no rebound.       Diffuse tenderness, worst over the epigastrium. Tenderness includes both the RUQ and the mcburneys  Genitourinary: Vagina normal and uterus normal.       External exam - normal, no lesions Speculum exam: Pt  has no discharge, + blood Bimanual exam: Patient has no CMT, no adnexal tenderness or fullness and cervical os is closed  Neurological: She is alert and oriented to person, place, and time.  Skin: Skin is warm and dry.    ED Course  Procedures (including critical care time)   Labs Reviewed  CBC WITH DIFFERENTIAL  BASIC METABOLIC PANEL  PREGNANCY, URINE  WET PREP, GENITAL  GC/CHLAMYDIA PROBE AMP  LIPASE, BLOOD  HEPATIC FUNCTION PANEL  MAGNESIUM   No results found.   No diagnosis found.    MDM  DDx includes: Pancreatitis Hepatobiliary pathology including cholecystitis Gastritis/PUD SBO ACS syndrome Aortic Dissection Colitis AAA Tumors Colitis Intra abdominal abscess Thrombosis Mesenteric ischemia Diverticulitis Peritonitis Appendicitis Hernia Nephrolithiasis Pyelonephritis UTI/Cystitis Ovarian cyst TOA Ectopic pregnancy PID  Pt comes in with cc of abd pain. Pt noted to be writhing in pain, with + guarding, but otherwise no other peritoneal signs.  Pt has epigastric pain, with associated n/v/diarrhea. She does have diffuse tenderness, including tenderness over the the Mcburneys.  Will need a pelvic exam, and we will start with basic labs and [place]. We will also need a CT given the diffuse nature of the pain.          Derwood Kaplan, MD 04/25/12 1546  Derwood Kaplan, MD 04/25/12 1610

## 2012-04-25 NOTE — ED Notes (Signed)
Patient transported to X-ray 

## 2012-04-25 NOTE — ED Notes (Signed)
Patient unable to void. RN [place] notified.

## 2012-04-25 NOTE — ED Notes (Signed)
Patient is alert and oriented x3.  She was given DC instructions and follow up visit instructions.  Patient gave verbal understanding. She was DC ambulatory under her own power to home.  V/S stable.  He was not showing any signs of distress on DC 

## 2013-01-14 ENCOUNTER — Emergency Department (HOSPITAL_COMMUNITY)
Admission: EM | Admit: 2013-01-14 | Discharge: 2013-01-14 | Disposition: A | Discharge status: Home / Self Care | Payer: BC Managed Care – PPO | Attending: Emergency Medicine | Admitting: Emergency Medicine

## 2013-01-14 ENCOUNTER — Encounter (HOSPITAL_COMMUNITY): Payer: Self-pay | Admitting: *Deleted

## 2013-01-14 DIAGNOSIS — M545 Low back pain, unspecified: Secondary | ICD-10-CM | POA: Insufficient documentation

## 2013-01-14 DIAGNOSIS — M62830 Muscle spasm of back: Secondary | ICD-10-CM

## 2013-01-14 DIAGNOSIS — Z862 Personal history of diseases of the blood and blood-forming organs and certain disorders involving the immune mechanism: Secondary | ICD-10-CM | POA: Insufficient documentation

## 2013-01-14 DIAGNOSIS — M5380 Other specified dorsopathies, site unspecified: Secondary | ICD-10-CM | POA: Insufficient documentation

## 2013-01-14 DIAGNOSIS — M549 Dorsalgia, unspecified: Secondary | ICD-10-CM

## 2013-01-14 MED ORDER — MELOXICAM 7.5 MG PO TABS: 15.0000 mg | ORAL_TABLET | Freq: Every day | ORAL | Status: DC

## 2013-01-14 MED ORDER — CYCLOBENZAPRINE HCL 10 MG PO TABS: 10.0000 mg | ORAL_TABLET | Freq: Three times a day (TID) | ORAL | Status: DC | PRN

## 2013-01-14 NOTE — ED Notes (Signed)
Pt c/o slight back pain since son born; he's 25 yrs old; states in last few wks affecting her walking; states pain lower back beside tailbone down right leg

## 2013-01-14 NOTE — ED Provider Notes (Signed)
CSN: [id number]     Arrival date & time 01/14/13  2117 History  This chart was scribed for Ivonne Andrew, PA working with Gilda Crease, MD by Quintella Reichert, ED Scribe. This patient was seen in room WTR5/WTR5 and the patient's care was started at 10:31 PM.    Chief Complaint  Patient presents with  . Back Pain    The history is provided by the patient. No language interpreter was used.    HPI Comments: Leslie Hatfield is a 27 y.o. female who presents to the Emergency Department complaining of a flare-up of her recurrent intermittent lower back pain.  Pt has had similar pain intermittently for the past 2 years since the birth of her son.  Her current exacerbation began 3 weeks ago.  She localizes pain to the right lower back near her tailbone.  She states that at baseline her pain is mild or not present, but with certain movements and activities she develops a sudden-onset moderate-to-severe sharp catching pain in that area, with radiation into the outside of right upper leg.  She states if she is active for a prolonged period of time she ends up in bed due to the pain.  She has attempted to treat pain with ibuprofen, without relief.  Pt notes that she is on her feet and bends and lifts frequently at her job.  However she has been working at this job for several years and denies recent injuries or unusual activities that may have exacerbated her pain.  She denies bowel or bladder incontinence, difficulty urinating, groin numbness, or any other associated symptoms.   Past Medical History  Diagnosis Date  . No pertinent past medical history   . Anemia     Past Surgical History  Procedure Laterality Date  . Vaginal polyps removed    . Vaginal poloyps    . Cesarean section  12/04/2010    Procedure: CESAREAN SECTION;  Surgeon: Oliver Pila;  Location: [facility];  Service: Gynecology;  Laterality: N/A;  . Cesarean section      Family History  Problem Relation Age of Onset  .  Heart disease Maternal Grandmother   . Heart disease Maternal Grandfather   . Diabetes Paternal Grandfather   . Diabetes Paternal Grandmother     History  Substance Use Topics  . Smoking status: Never Smoker   . Smokeless tobacco: Not on file  . Alcohol Use: No    OB History   Grav Para Term Preterm Abortions TAB SAB Ect Mult Living   1 1 1               Review of Systems  Gastrointestinal: Negative for diarrhea.  Genitourinary: Negative for difficulty urinating.  Musculoskeletal: Positive for back pain.  Neurological: Negative for weakness and numbness.  All other systems reviewed and are negative.      Allergies  Review of patient's allergies indicates no known allergies.  Home Medications   Current Outpatient Rx  Name  Route  Sig  Dispense  Refill  . ibuprofen (ADVIL,MOTRIN) 200 MG tablet   Oral   Take 800 mg by mouth every 6 (six) hours as needed. pain          BP 151/88  Pulse 106  Temp(Src) 98.1 F (36.7 C) (Oral)  Resp 18  SpO2 100%  LMP 12/15/2012  Physical Exam  Nursing note and vitals reviewed. Constitutional: She is oriented to person, place, and time. She appears well-developed and well-nourished. No distress.  HENT:  Head: Normocephalic and atraumatic.  Eyes: EOM are normal.  Neck: Neck supple. No tracheal deviation present.  Cardiovascular: Normal rate, regular rhythm, normal heart sounds and intact distal pulses.   No murmur heard. Pulmonary/Chest: Effort normal and breath sounds normal. No respiratory distress. She has no wheezes. She has no rales.  Musculoskeletal: Normal range of motion.       Lumbar back: She exhibits tenderness and spasm. She exhibits no bony tenderness and no deformity.       Back:  Tender to lower right paraspinous and sacral area.  Neurological: She is alert and oriented to person, place, and time.  Skin: Skin is warm and dry.  Psychiatric: She has a normal mood and affect. Her behavior is normal.    ED  Course  Procedures   DIAGNOSTIC STUDIES: Oxygen Saturation is 100% on room air, normal by my interpretation.    COORDINATION OF CARE: 10:47 PM-Discussed treatment plan which includes muscle relaxants and pain medications with pt at bedside and pt agreed to plan.    Labs Review Labs Reviewed - No data to display  Imaging Review No results found.  MDM   1. Back pain   2. Muscle spasm of back    Patient seen and evaluated. Patient with no significant injury or trauma. She has had intermittent pains occasionally with periods of brief sharp pains consistent with muscle spasms. No concerning history to warrant any imaging at this time. Patient encouraged followup with PCP.    I personally performed the services described in this documentation, which was scribed in my presence. The recorded information has been reviewed and is accurate.    Angus Seller, PA-C 01/15/13 [phone]

## 2013-01-14 NOTE — ED Notes (Signed)
Pt ambulatory to exam room with steady gait. Pt arrives with companion.  

## 2013-01-18 NOTE — ED Provider Notes (Signed)
Medical screening examination/treatment/procedure(s) were performed by non-physician practitioner and as supervising physician I was immediately available for consultation/collaboration.    Christopher J. Pollina, MD 01/18/13 0722 

## 2013-10-19 ENCOUNTER — Encounter (HOSPITAL_COMMUNITY): Payer: Self-pay | Admitting: *Deleted

## 2013-10-19 ENCOUNTER — Inpatient Hospital Stay (HOSPITAL_COMMUNITY)
Admission: AD | Admit: 2013-10-19 | Discharge: 2013-10-19 | Disposition: A | Discharge status: Home / Self Care | Payer: 59 | Source: Ambulatory Visit | Attending: Obstetrics and Gynecology | Admitting: Obstetrics and Gynecology

## 2013-10-19 DIAGNOSIS — O200 Threatened abortion: Secondary | ICD-10-CM | POA: Insufficient documentation

## 2013-10-19 DIAGNOSIS — O209 Hemorrhage in early pregnancy, unspecified: Secondary | ICD-10-CM

## 2013-10-19 LAB — URINALYSIS, ROUTINE W REFLEX MICROSCOPIC
Bilirubin Urine: NEGATIVE
Glucose, UA: NEGATIVE mg/dL
Ketones, ur: NEGATIVE mg/dL
LEUKOCYTES UA: NEGATIVE
Nitrite: NEGATIVE
PROTEIN: NEGATIVE mg/dL
Specific Gravity, Urine: 1.01 (ref 1.005–1.030)
UROBILINOGEN UA: 0.2 mg/dL (ref 0.0–1.0)
pH: 6 (ref 5.0–8.0)

## 2013-10-19 LAB — WET PREP, GENITAL
CLUE CELLS WET PREP: NONE SEEN
Trich, Wet Prep: NONE SEEN
Yeast Wet Prep HPF POC: NONE SEEN

## 2013-10-19 LAB — HCG, QUANTITATIVE, PREGNANCY: hCG, Beta Chain, Quant, S: 487 m[IU]/mL — ABNORMAL HIGH (ref <5)

## 2013-10-19 LAB — POCT PREGNANCY, URINE: PREG TEST UR: POSITIVE — AB

## 2013-10-19 LAB — CBC
HCT: 38.2 % (ref 36.0–46.0)
Hemoglobin: 12.4 g/dL (ref 12.0–15.0)
MCH: 29 pg (ref 26.0–34.0)
MCHC: 32.5 g/dL (ref 30.0–36.0)
MCV: 89.5 fL (ref 78.0–100.0)
Platelets: 237 10*3/uL (ref 150–400)
RBC: 4.27 MIL/uL (ref 3.87–5.11)
RDW: 13.2 % (ref 11.5–15.5)
WBC: 5.8 10*3/uL (ref 4.0–10.5)

## 2013-10-19 LAB — URINE MICROSCOPIC-ADD ON

## 2013-10-19 NOTE — Discharge Instructions (Signed)
Vaginal Bleeding During Pregnancy, First Trimester °A small amount of bleeding (spotting) from the vagina is relatively common in early pregnancy. It usually stops on its own. Various things may cause bleeding or spotting in early pregnancy. Some bleeding may be related to the pregnancy, and some may not. In most cases, the bleeding is normal and is not a problem. However, bleeding can also be a sign of something serious. Be sure to tell your health care provider about any vaginal bleeding right away. °Some possible causes of vaginal bleeding during the first trimester include: °· Infection or inflammation of the cervix. °· Growths (polyps) on the cervix. °· Miscarriage or threatened miscarriage. °· Pregnancy tissue has developed outside of the uterus and in a fallopian tube (tubal pregnancy). °· Tiny cysts have developed in the uterus instead of pregnancy tissue (molar pregnancy). °HOME CARE INSTRUCTIONS  °Watch your condition for any changes. The following actions may help to lessen any discomfort you are feeling: °· Follow your health care provider's instructions for limiting your activity. If your health care provider orders bed rest, you may need to stay in bed and only get up to use the bathroom. However, your health care provider may allow you to continue light activity. °· If needed, make plans for someone to help with your regular activities and responsibilities while you are on bed rest. °· Keep track of the number of pads you use each day, how often you change pads, and how soaked (saturated) they are. Write this down. °· Do not use tampons. Do not douche. °· Do not have sexual intercourse or orgasms until approved by your health care provider. °· If you pass any tissue from your vagina, save the tissue so you can show it to your health care provider. °· Only take over-the-counter or prescription medicines as directed by your health care provider. °· Do not take aspirin because it can make you  bleed. °· Keep all follow-up appointments as directed by your health care provider. °SEEK MEDICAL CARE IF: °· You have any vaginal bleeding during any part of your pregnancy. °· You have cramps or labor pains. °SEEK IMMEDIATE MEDICAL CARE IF:  °· You have severe cramps in your back or belly (abdomen). °· You have a fever, not controlled by medicine. °· You pass large clots or tissue from your vagina. °· Your bleeding increases. °· You feel lightheaded or weak, or you have fainting episodes. °· You have chills. °· You are leaking fluid or have a gush of fluid from your vagina. °· You pass out while having a bowel movement. °MAKE SURE YOU: °· Understand these instructions. °· Will watch your condition. °· Will get help right away if you are not doing well or get worse. °Document Released: 02/13/2005 Document Revised: 02/24/2013 Document Reviewed: 01/11/2013 °ExitCare® Patient Information ©2014 [employer]. ° °

## 2013-10-19 NOTE — MAU Provider Note (Signed)
History     CSN: [id number]  Arrival date and time: 10/19/13 1048   None     Chief Complaint  Patient presents with  . Threatened Miscarriage   HPI  28 yo G3P1011 @ [redacted]w[redacted]d by LMP who presents for vaginal bleeding that started yesterday.    Pt missed her period this month so took 2 HPT yesterday and they were positive Today started bleedign so was sent here from work to get checked out Having light bleeding, bright red.  Has only had to use one pad and very light streak on it Most of the bleeding is when she wipes  No abd pain.   No fevers, chills, nausea, vomiting, diarrhea, constipation, ha, cp, sob  Past Medical History  Diagnosis Date  . No pertinent past medical history   . Anemia     Past Surgical History  Procedure Laterality Date  . Vaginal polyps removed    . Vaginal poloyps    . Cesarean section  12/04/2010    Procedure: CESAREAN SECTION;  Surgeon: Oliver Pila;  Location: [facility];  Service: Gynecology;  Laterality: N/A;  . Cesarean section      Family History  Problem Relation Age of Onset  . Heart disease Maternal Grandmother   . Heart disease Maternal Grandfather   . Diabetes Paternal Grandfather   . Diabetes Paternal Grandmother     History  Substance Use Topics  . Smoking status: Never Smoker   . Smokeless tobacco: Not on file  . Alcohol Use: No    Allergies: No Known Allergies  Prescriptions prior to admission  Medication Sig Dispense Refill  . cyclobenzaprine (FLEXERIL) 10 MG tablet Take 1 tablet (10 mg total) by mouth 3 (three) times daily as needed for muscle spasms.  30 tablet  0  . ibuprofen (ADVIL,MOTRIN) 200 MG tablet Take 800 mg by mouth every 6 (six) hours as needed. pain      . meloxicam (MOBIC) 7.5 MG tablet Take 2 tablets (15 mg total) by mouth daily.  20 tablet  0    Review of Systems  Constitutional: Negative for fever and chills.  Respiratory: Negative for cough.   Cardiovascular: Negative for chest pain and  palpitations.  Gastrointestinal: Negative for heartburn, nausea, vomiting and abdominal pain.  Genitourinary: Negative for dysuria, urgency and frequency.  Neurological: Negative for headaches.  Psychiatric/Behavioral: Negative for depression.   Physical Exam   Blood pressure 127/82, pulse 93, temperature 98.2 F (36.8 C), temperature source Oral, resp. rate 18, height 5\' 3"  (1.6 m), weight 101.152 kg (223 lb), last menstrual period 09/17/2013.  Physical Exam  Constitutional: She appears well-developed and well-nourished.  HENT:  Head: Normocephalic.  Neck: Neck supple.  Cardiovascular: Normal rate, regular rhythm and normal heart sounds.   Respiratory: Effort normal and breath sounds normal.  GI: Soft. Bowel sounds are normal. She exhibits no distension. There is no tenderness. There is no rebound.  Genitourinary:  NEFG, vaginal vault with small amount of bright red blood and no clots. Cleared with 2 faux swabs. Cervix closed.  No uterine or adnexal tenderness.     MAU Course  Procedures  MDM CBC B+ blood type HCG  Assessment and Plan   28 yo G3P1011 @ [redacted]w[redacted]d here for vaginal bleeding.   Small amount of bleeding on exam.  Rh + blood type. CBC with normal hgb.  Quant too low for [place] to be of value Will have pt f/u in 2 days in the office for repeat  quant  Bleeding and ectopic precautions discussed.     Case and plan discussed with Dr. Jackelyn KnifeMeisinger.     Channin Agustin L Wiatt Mahabir 10/19/2013, 12:37 PM

## 2013-10-19 NOTE — MAU Note (Signed)
Missed period.  Took 2 HPTs test yesterday, both were positive.  Woke up with bleeding during the night, like a light period now.  Hx SAB.  Had some discomfort last night, no pain currently.

## 2013-10-20 LAB — GC/CHLAMYDIA PROBE AMP
CT Probe RNA: NEGATIVE
GC Probe RNA: NEGATIVE

## 2014-01-02 IMAGING — CR DG CHEST 2V
3 series · 3 of 3 positions shown · non-contrast
Comparison: None.

CLINICAL DATA: Fever, cough

CHEST - 2 VIEW

[view not recorded (1 of 3)]
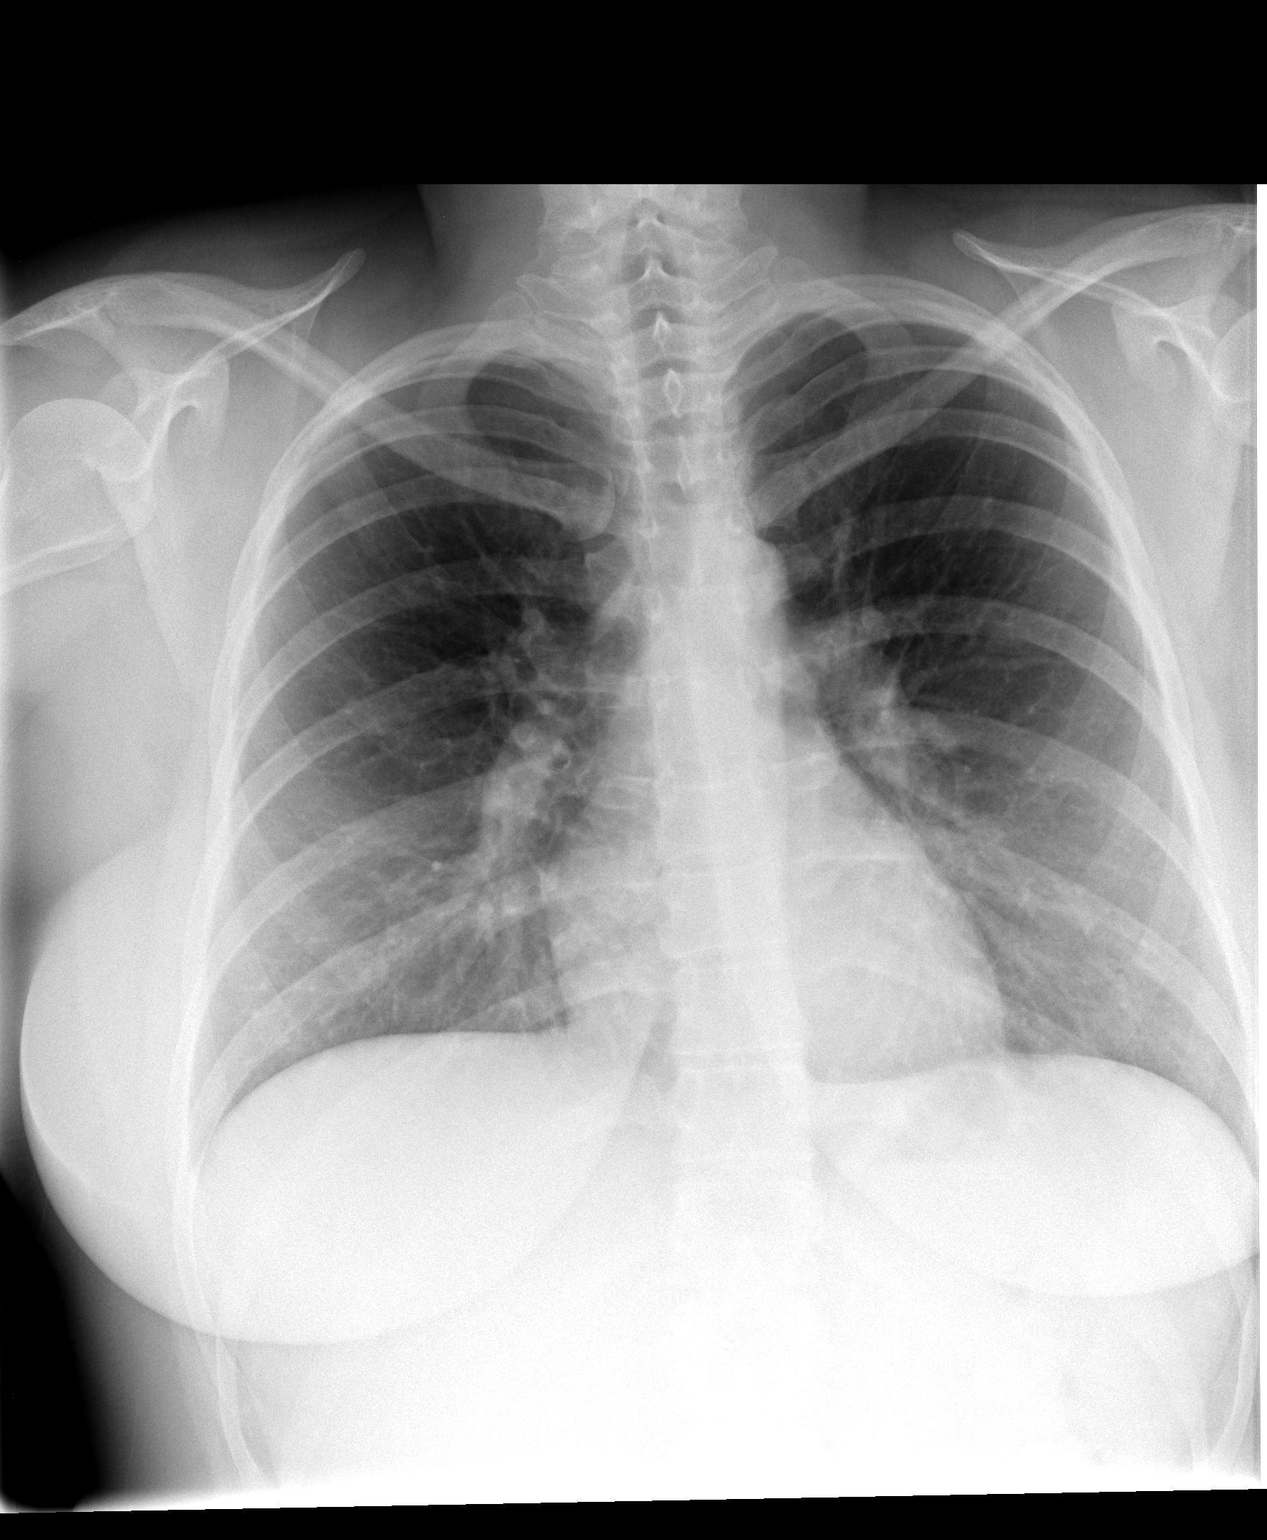

[view not recorded (2 of 3)]
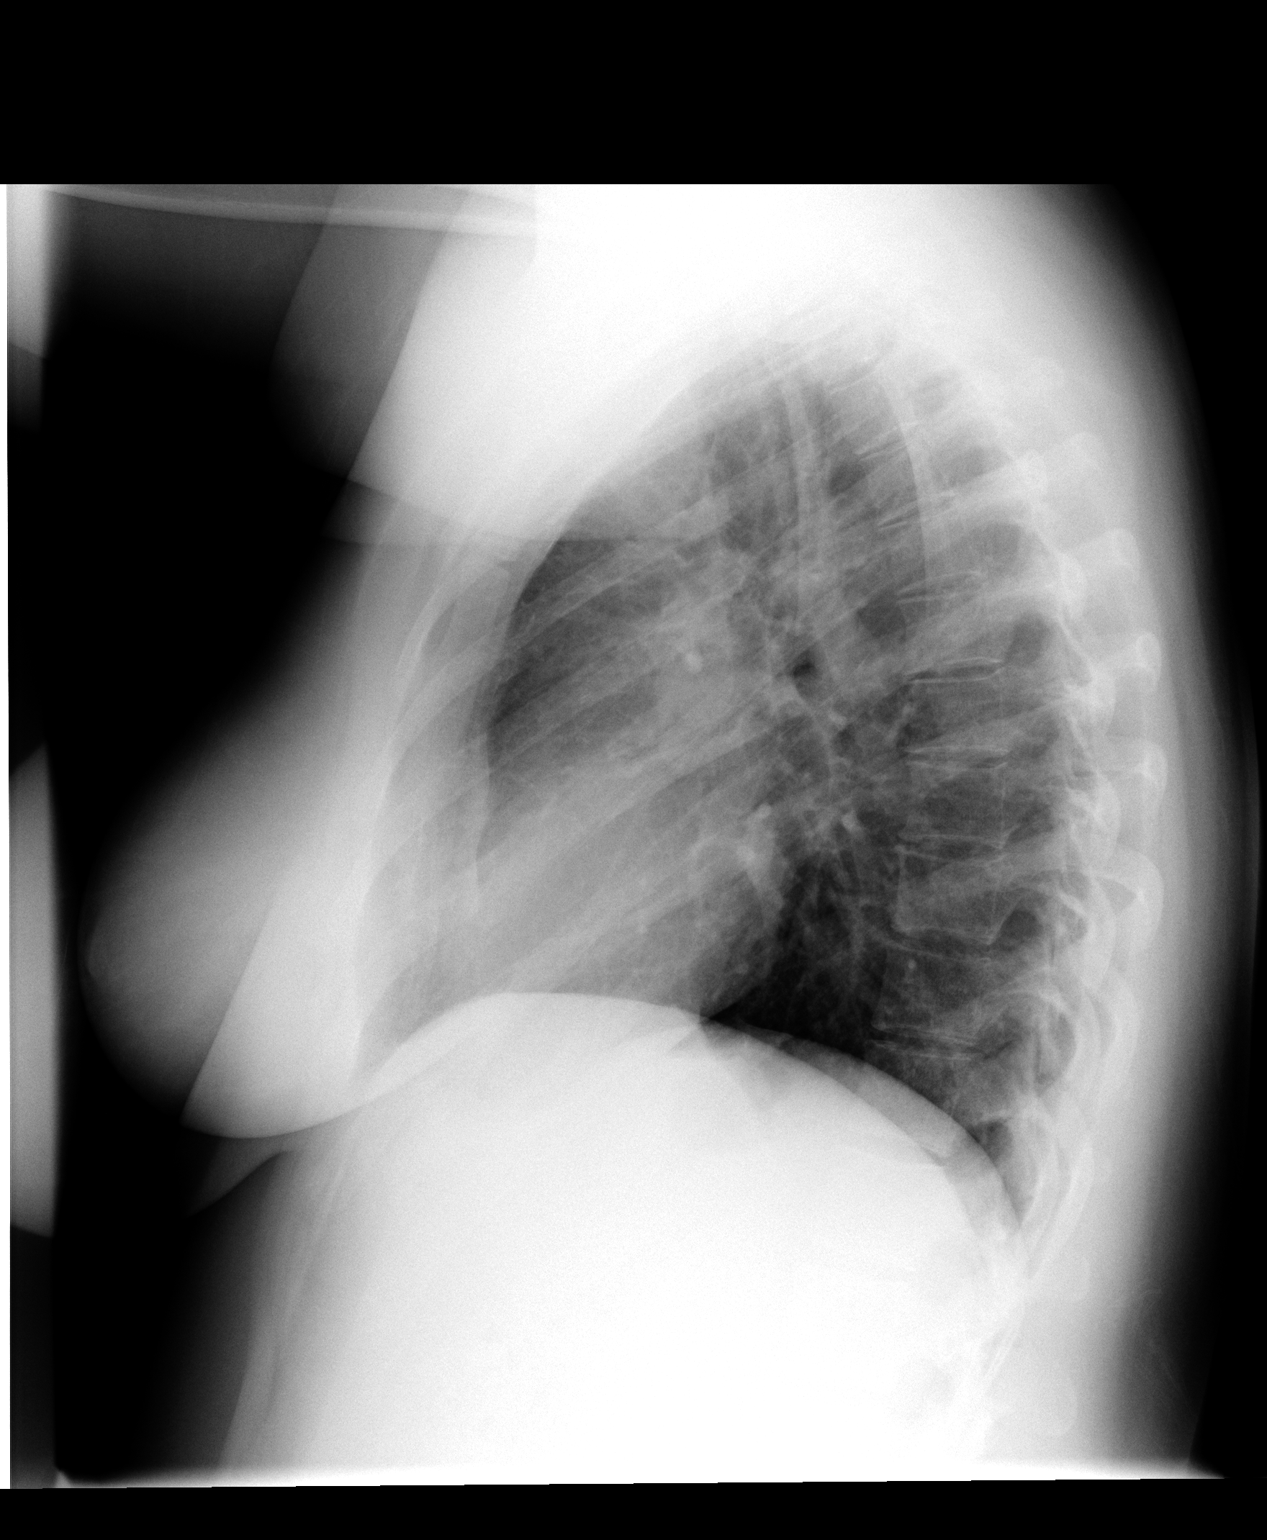

[view not recorded (3 of 3)]
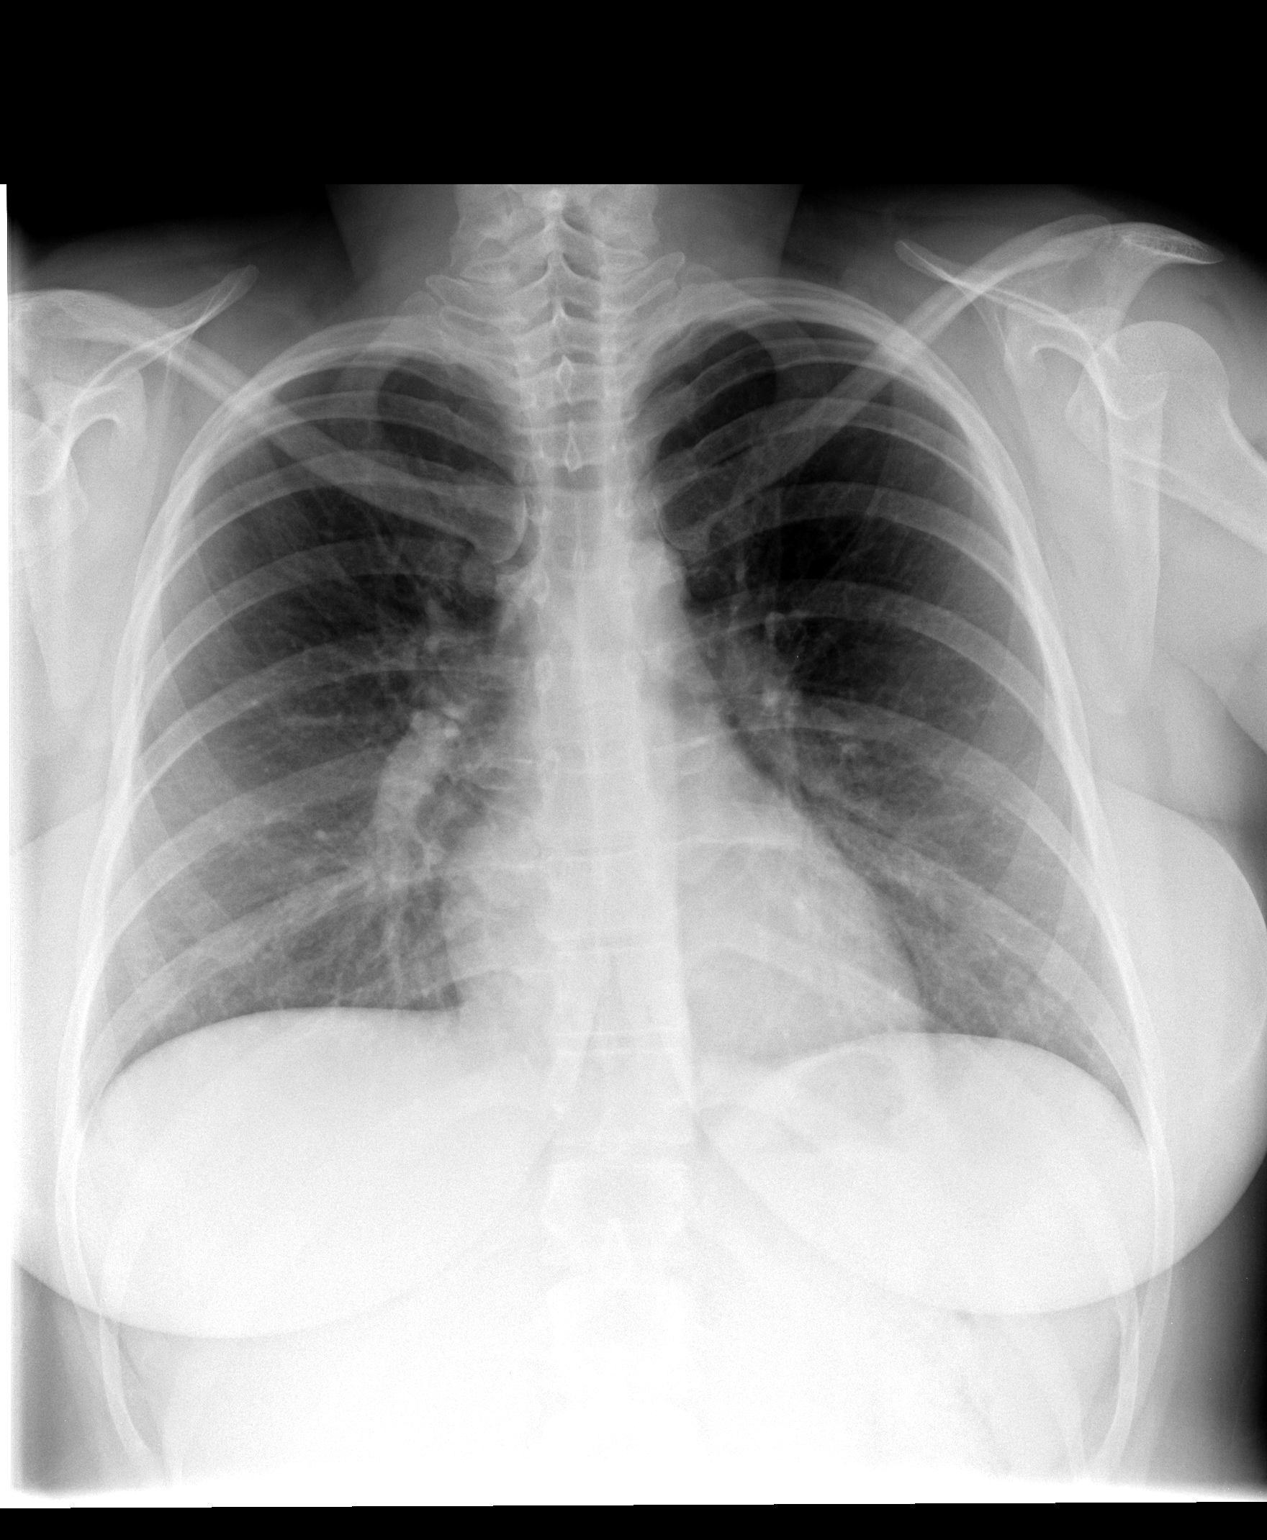

[3 of 3 positions shown; findings below may reference images not displayed]

FINDINGS: Lungs are clear. No pleural effusion or pneumothorax.

Cardiomediastinal silhouette is within normal limits.

Visualized osseous structures are within normal limits.
IMPRESSION: Normal chest radiographs.

## 2014-03-21 ENCOUNTER — Encounter (HOSPITAL_COMMUNITY): Payer: Self-pay | Admitting: *Deleted

## 2014-06-21 ENCOUNTER — Inpatient Hospital Stay (HOSPITAL_COMMUNITY)
Admission: AD | Admit: 2014-06-21 | Discharge: 2014-06-22 | Disposition: A | Discharge status: Home / Self Care | Payer: Medicaid Other | Source: Ambulatory Visit | Attending: Family Medicine | Admitting: Family Medicine

## 2014-06-21 ENCOUNTER — Inpatient Hospital Stay (HOSPITAL_COMMUNITY): Payer: Medicaid Other

## 2014-06-21 ENCOUNTER — Encounter (HOSPITAL_COMMUNITY): Payer: Self-pay | Admitting: *Deleted

## 2014-06-21 DIAGNOSIS — O26899 Other specified pregnancy related conditions, unspecified trimester: Secondary | ICD-10-CM

## 2014-06-21 DIAGNOSIS — Z3A01 Less than 8 weeks gestation of pregnancy: Secondary | ICD-10-CM | POA: Diagnosis not present

## 2014-06-21 DIAGNOSIS — O9989 Other specified diseases and conditions complicating pregnancy, childbirth and the puerperium: Secondary | ICD-10-CM | POA: Diagnosis not present

## 2014-06-21 DIAGNOSIS — R109 Unspecified abdominal pain: Secondary | ICD-10-CM | POA: Diagnosis present

## 2014-06-21 LAB — CBC WITH DIFFERENTIAL/PLATELET
BASOS PCT: 0 % (ref 0–1)
Basophils Absolute: 0 10*3/uL (ref 0.0–0.1)
EOS ABS: 0.1 10*3/uL (ref 0.0–0.7)
Eosinophils Relative: 1 % (ref 0–5)
HCT: 38.3 % (ref 36.0–46.0)
HEMOGLOBIN: 12.3 g/dL (ref 12.0–15.0)
LYMPHS ABS: 1.3 10*3/uL (ref 0.7–4.0)
Lymphocytes Relative: 25 % (ref 12–46)
MCH: 28 pg (ref 26.0–34.0)
MCHC: 32.1 g/dL (ref 30.0–36.0)
MCV: 87.2 fL (ref 78.0–100.0)
Monocytes Absolute: 0.4 10*3/uL (ref 0.1–1.0)
Monocytes Relative: 8 % (ref 3–12)
NEUTROS PCT: 66 % (ref 43–77)
Neutro Abs: 3.5 10*3/uL (ref 1.7–7.7)
PLATELETS: 226 10*3/uL (ref 150–400)
RBC: 4.39 MIL/uL (ref 3.87–5.11)
RDW: 13.5 % (ref 11.5–15.5)
WBC: 5.4 10*3/uL (ref 4.0–10.5)

## 2014-06-21 LAB — URINALYSIS, ROUTINE W REFLEX MICROSCOPIC
Bilirubin Urine: NEGATIVE
GLUCOSE, UA: NEGATIVE mg/dL
Hgb urine dipstick: NEGATIVE
KETONES UR: NEGATIVE mg/dL
LEUKOCYTES UA: NEGATIVE
Nitrite: NEGATIVE
PROTEIN: NEGATIVE mg/dL
SPECIFIC GRAVITY, URINE: 1.025 (ref 1.005–1.030)
Urobilinogen, UA: 0.2 mg/dL (ref 0.0–1.0)
pH: 7.5 (ref 5.0–8.0)

## 2014-06-21 LAB — POCT PREGNANCY, URINE: PREG TEST UR: POSITIVE — AB

## 2014-06-21 NOTE — MAU Provider Note (Signed)
History     CSN: [id number]  Arrival date and time: 06/21/14 2140   First Provider Initiated Contact with Patient 06/21/14 2304      Chief Complaint  Patient presents with  . Abdominal Pain  . Possible Pregnancy   HPI  Ms. Leslie Hatfield is a 29 y.o. G4P1021 at [id number] who presents to MAU today with complaint of lower abdominal pain. She states recent +HPT and LMP of 05/11/14. She denies vaginal bleeding, discharge, UTI symptoms or fever. She states some mild nausea without vomiting. She is concerned because of two previous SABs.    OB History    Gravida Para Term Preterm AB TAB SAB Ectopic Multiple Living   4 1 1  1  1   1       Past Medical History  Diagnosis Date  . No pertinent past medical history   . Anemia     Past Surgical History  Procedure Laterality Date  . Vaginal polyps removed    . Vaginal poloyps    . Cesarean section  12/04/2010    Procedure: CESAREAN SECTION;  Surgeon: Oliver PilaKathy W Richardson;  Location: [facility];  Service: Gynecology;  Laterality: N/A;  . Cesarean section      Family History  Problem Relation Age of Onset  . Heart disease Maternal Grandmother   . Heart disease Maternal Grandfather   . Diabetes Paternal Grandfather   . Diabetes Paternal Grandmother     History  Substance Use Topics  . Smoking status: Never Smoker   . Smokeless tobacco: Not on file  . Alcohol Use: No    Allergies: No Known Allergies  Prescriptions prior to admission  Medication Sig Dispense Refill Last Dose  . ibuprofen (ADVIL,MOTRIN) 200 MG tablet Take 800 mg by mouth every 6 (six) hours as needed. pain   10/18/2013 at Unknown time  . Prenatal Vit-Fe Fumarate-FA (PRENATAL MULTIVITAMIN) TABS tablet Take 1 tablet by mouth daily at 12 noon.   10/18/2013 at Unknown time    Review of Systems  Constitutional: Negative for fever and malaise/fatigue.  Gastrointestinal: Positive for nausea and abdominal pain. Negative for vomiting.  Genitourinary: Negative for dysuria,  urgency and frequency.       Neg - vaginal bleeding, discharge   Physical Exam   Blood pressure 114/72, pulse 102, resp. rate 18, height 5\' 4"  (1.626 m), weight 219 lb (99.338 kg), last menstrual period 05/11/2014, SpO2 100 %, unknown if currently breastfeeding.  Physical Exam  Constitutional: She is oriented to person, place, and time. She appears well-developed and well-nourished. No distress.  HENT:  Head: Normocephalic.  Cardiovascular: Normal rate.   Respiratory: Effort normal.  GI: Soft. She exhibits no distension and no mass. There is no tenderness. There is no rebound.  Genitourinary: Uterus is tender (mild). Uterus is not enlarged. Cervix exhibits no motion tenderness, no discharge and no friability. Right adnexum displays no mass and no tenderness. Left adnexum displays no mass and no tenderness. No bleeding in the vagina. Vaginal discharge (scant thin, white discharge noted) found.  Neurological: She is alert and oriented to person, place, and time.  Skin: Skin is warm and dry. No erythema.  Psychiatric: She has a normal mood and affect.   Results for orders placed or performed during the hospital encounter of 06/21/14 (from the past 24 hour(s))  Urinalysis, Routine w reflex microscopic     Status: Abnormal   Collection Time: 06/21/14 10:13 PM  Result Value Ref Range   Color, Urine YELLOW YELLOW  APPearance HAZY (A) CLEAR   Specific Gravity, Urine 1.025 1.005 - 1.030   pH 7.5 5.0 - 8.0   Glucose, UA NEGATIVE NEGATIVE mg/dL   Hgb urine dipstick NEGATIVE NEGATIVE   Bilirubin Urine NEGATIVE NEGATIVE   Ketones, ur NEGATIVE NEGATIVE mg/dL   Protein, ur NEGATIVE NEGATIVE mg/dL   Urobilinogen, UA 0.2 0.0 - 1.0 mg/dL   Nitrite NEGATIVE NEGATIVE   Leukocytes, UA NEGATIVE NEGATIVE  Pregnancy, urine POC     Status: Abnormal   Collection Time: 06/21/14 10:36 PM  Result Value Ref Range   Preg Test, Ur POSITIVE (A) NEGATIVE  hCG, quantitative, pregnancy     Status: Abnormal    Collection Time: 06/21/14 11:08 PM  Result Value Ref Range   hCG, Beta Chain, Quant, S 1336 (H) <5 mIU/mL  CBC with Differential/Platelet     Status: None   Collection Time: 06/21/14 11:08 PM  Result Value Ref Range   WBC 5.4 4.0 - 10.5 K/uL   RBC 4.39 3.87 - 5.11 MIL/uL   Hemoglobin 12.3 12.0 - 15.0 g/dL   HCT 45.4 09.8 - 11.9 %   MCV 87.2 78.0 - 100.0 fL   MCH 28.0 26.0 - 34.0 pg   MCHC 32.1 30.0 - 36.0 g/dL   RDW 14.7 82.9 - 56.2 %   Platelets 226 150 - 400 K/uL   Neutrophils Relative % 66 43 - 77 %   Neutro Abs 3.5 1.7 - 7.7 K/uL   Lymphocytes Relative 25 12 - 46 %   Lymphs Abs 1.3 0.7 - 4.0 K/uL   Monocytes Relative 8 3 - 12 %   Monocytes Absolute 0.4 0.1 - 1.0 K/uL   Eosinophils Relative 1 0 - 5 %   Eosinophils Absolute 0.1 0.0 - 0.7 K/uL   Basophils Relative 0 0 - 1 %   Basophils Absolute 0.0 0.0 - 0.1 K/uL  Wet prep, genital     Status: Abnormal   Collection Time: 06/22/14 12:41 AM  Result Value Ref Range   Yeast Wet Prep HPF POC NONE SEEN NONE SEEN   Trich, Wet Prep NONE SEEN NONE SEEN   Clue Cells Wet Prep HPF POC NONE SEEN NONE SEEN   WBC, Wet Prep HPF POC FEW (A) NONE SEEN   US Ob Comp Less 14 Wks  06/21/2014   CLINICAL DATA:  Pregnant patient with abdominal, pelvic and back pain. Last menstrual period 05/11/2014. Beta HCG is pending.  EXAM: OBSTETRIC <14 WK [place] AND TRANSVAGINAL OB US  TECHNIQUE: Both transabdominal and transvaginal ultrasound examinations were performed for complete evaluation of the gestation as well as the maternal uterus, adnexal regions, and pelvic cul-de-sac. Transvaginal technique was performed to assess early pregnancy.  COMPARISON:  None.  FINDINGS: The uterus is retroverted. The endometrium is markedly thickened measuring 3.2 cm. There is no intrauterine gestational sac. No fluid in the endometrial canal. The left ovary measures 2.3 x 4.5 x 2.8 cm and contains a 1.6 x 1.5 x 1.6 cm cyst, likely corpus luteum. Right ovary measures 1.5 x 3.3 x 2.1  cm and appears normal. There is no pelvic free fluid.  IMPRESSION: No intrauterine pregnancy. The endometrium is markedly thickened at 3.2 cm. This can be seen in very early pregnancy not yet detected sonographically. There is a 1.6 cm cyst in the left ovary likely a corpus luteum. There are no findings of ectopic pregnancy. Trending of beta HCG as well as follow-up ultrasound in 10-14 days is recommended.   Electronically Signed  By: Rubye Oaks M.D.   On: 06/21/2014 23:42   US Ob Transvaginal  06/21/2014   CLINICAL DATA:  Pregnant patient with abdominal, pelvic and back pain. Last menstrual period 05/11/2014. Beta HCG is pending.  EXAM: OBSTETRIC <14 WK [place] AND TRANSVAGINAL OB US  TECHNIQUE: Both transabdominal and transvaginal ultrasound examinations were performed for complete evaluation of the gestation as well as the maternal uterus, adnexal regions, and pelvic cul-de-sac. Transvaginal technique was performed to assess early pregnancy.  COMPARISON:  None.  FINDINGS: The uterus is retroverted. The endometrium is markedly thickened measuring 3.2 cm. There is no intrauterine gestational sac. No fluid in the endometrial canal. The left ovary measures 2.3 x 4.5 x 2.8 cm and contains a 1.6 x 1.5 x 1.6 cm cyst, likely corpus luteum. Right ovary measures 1.5 x 3.3 x 2.1 cm and appears normal. There is no pelvic free fluid.  IMPRESSION: No intrauterine pregnancy. The endometrium is markedly thickened at 3.2 cm. This can be seen in very early pregnancy not yet detected sonographically. There is a 1.6 cm cyst in the left ovary likely a corpus luteum. There are no findings of ectopic pregnancy. Trending of beta HCG as well as follow-up ultrasound in 10-14 days is recommended.   Electronically Signed   By: Rubye Oaks M.D.   On: 06/21/2014 23:42     MAU Course  Procedures None  MDM +UPT UA, wet prep, GC/Chlamydia, CBC, quant hCG, HIV and [place] today  Assessment and Plan  A: Abdominal pain in  pregnancy  P: Discharge home Advised Tylenol PRN for pain Patient advised to return to MAU in 48 hours for follow-up quant or sooner if symptoms change or worsen Bleeding/ecoptic precautions discussed Patient may return to MAU as needed or if her condition were to change or worsen   Marny Lowenstein, PA-C  06/22/2014, 1:00 AM

## 2014-06-21 NOTE — MAU Note (Signed)
Pt reports lower abd and lower back pain for the last 2 days, denies bleeding. LMP 12/23, [facility]. Has history of SAB x 2 and was worried. Denies dysuria.

## 2014-06-22 ENCOUNTER — Encounter (HOSPITAL_COMMUNITY): Payer: Self-pay | Admitting: *Deleted

## 2014-06-22 DIAGNOSIS — O9989 Other specified diseases and conditions complicating pregnancy, childbirth and the puerperium: Secondary | ICD-10-CM

## 2014-06-22 DIAGNOSIS — Z3A01 Less than 8 weeks gestation of pregnancy: Secondary | ICD-10-CM

## 2014-06-22 DIAGNOSIS — R109 Unspecified abdominal pain: Secondary | ICD-10-CM

## 2014-06-22 LAB — WET PREP, GENITAL
CLUE CELLS WET PREP: NONE SEEN
Trich, Wet Prep: NONE SEEN
Yeast Wet Prep HPF POC: NONE SEEN

## 2014-06-22 LAB — GC/CHLAMYDIA PROBE AMP (~~LOC~~) NOT AT ARMC
Chlamydia: NEGATIVE
Neisseria Gonorrhea: NEGATIVE

## 2014-06-22 LAB — HIV ANTIBODY (ROUTINE TESTING W REFLEX): HIV SCREEN 4TH GENERATION: NONREACTIVE

## 2014-06-22 LAB — HCG, QUANTITATIVE, PREGNANCY: HCG, BETA CHAIN, QUANT, S: 1336 m[IU]/mL — AB (ref <5)

## 2014-06-22 NOTE — Discharge Instructions (Signed)
First Trimester of Pregnancy The first trimester of pregnancy is from week 1 until the end of week 12 (months 1 through 3). During this time, your baby will begin to develop inside you. At 6-8 weeks, the eyes and face are formed, and the heartbeat can be seen on ultrasound. At the end of 12 weeks, all the baby's organs are formed. Prenatal care is all the medical care you receive before the birth of your baby. Make sure you get good prenatal care and follow all of your doctor's instructions. HOME CARE  Medicines  Take medicine only as told by your doctor. Some medicines are safe and some are not during pregnancy.  Take your prenatal vitamins as told by your doctor.  Take medicine that helps you poop (stool softener) as needed if your doctor says it is okay. Diet  Eat regular, healthy meals.  Your doctor will tell you the amount of weight gain that is right for you.  Avoid raw meat and uncooked cheese.  If you feel sick to your stomach (nauseous) or throw up (vomit):  Eat 4 or 5 small meals a day instead of 3 large meals.  Try eating a few soda crackers.  Drink liquids between meals instead of during meals.  If you have a hard time pooping (constipation):  Eat high-fiber foods like fresh vegetables, fruit, and whole grains.  Drink enough fluids to keep your pee (urine) clear or pale yellow. Activity and Exercise  Exercise only as told by your doctor. Stop exercising if you have cramps or pain in your lower belly (abdomen) or low back.  Try to avoid standing for long periods of time. Move your legs often if you must stand in one place for a long time.  Avoid heavy lifting.  Wear low-heeled shoes. Sit and stand up straight.  You can have sex unless your doctor tells you not to. Relief of Pain or Discomfort  Wear a good support bra if your breasts are sore.  Take warm water baths (sitz baths) to soothe pain or discomfort caused by hemorrhoids. Use hemorrhoid cream if your  doctor says it is okay.  Rest with your legs raised if you have leg cramps or low back pain.  Wear support hose if you have puffy, bulging veins (varicose veins) in your legs. Raise (elevate) your feet for 15 minutes, 3-4 times a day. Limit salt in your diet. Prenatal Care  Schedule your prenatal visits by the twelfth week of pregnancy.  Write down your questions. Take them to your prenatal visits.  Keep all your prenatal visits as told by your doctor. Safety  Wear your seat belt at all times when driving.  Make a list of emergency phone numbers. The list should include numbers for family, friends, the hospital, and police and fire departments. General Tips  Ask your doctor for a referral to a local prenatal class. Begin classes no later than at the start of month 6 of your pregnancy.  Ask for help if you need counseling or help with nutrition. Your doctor can give you advice or tell you where to go for help.  Do not use hot tubs, steam rooms, or saunas.  Do not douche or use tampons or scented sanitary pads.  Do not cross your legs for long periods of time.  Avoid litter boxes and soil used by cats.  Avoid all smoking, herbs, and alcohol. Avoid drugs not approved by your doctor.  Visit your dentist. At home, brush your teeth   with a soft toothbrush. Be gentle when you floss. GET HELP IF:  You are dizzy.  You have mild cramps or pressure in your lower belly.  You have a nagging pain in your belly area.  You continue to feel sick to your stomach, throw up, or have watery poop (diarrhea).  You have a bad smelling fluid coming from your vagina.  You have pain with peeing (urination).  You have increased puffiness (swelling) in your face, hands, legs, or ankles. GET HELP RIGHT AWAY IF:   You have a fever.  You are leaking fluid from your vagina.  You have spotting or bleeding from your vagina.  You have very bad belly cramping or pain.  You gain or lose weight  rapidly.  You throw up blood. It may look like coffee grounds.  You are around people who have German measles, fifth disease, or chickenpox.  You have a very bad headache.  You have shortness of breath.  You have any kind of trauma, such as from a fall or a car accident. Document Released: 10/23/2007 Document Revised: 09/20/2013 Document Reviewed: 03/16/2013 ExitCare Patient Information 2015 [employer]. This information is not intended to replace advice given to you by your health care provider. Make sure you discuss any questions you have with your health care provider.  

## 2014-06-23 ENCOUNTER — Inpatient Hospital Stay (HOSPITAL_COMMUNITY)
Admission: AD | Admit: 2014-06-23 | Discharge: 2014-06-23 | Disposition: A | Discharge status: Home / Self Care | Payer: Medicaid Other | Source: Ambulatory Visit | Attending: Obstetrics and Gynecology | Admitting: Obstetrics and Gynecology

## 2014-06-23 DIAGNOSIS — Z3A01 Less than 8 weeks gestation of pregnancy: Secondary | ICD-10-CM | POA: Insufficient documentation

## 2014-06-23 DIAGNOSIS — O9989 Other specified diseases and conditions complicating pregnancy, childbirth and the puerperium: Secondary | ICD-10-CM | POA: Insufficient documentation

## 2014-06-23 DIAGNOSIS — O26899 Other specified pregnancy related conditions, unspecified trimester: Secondary | ICD-10-CM

## 2014-06-23 DIAGNOSIS — R109 Unspecified abdominal pain: Secondary | ICD-10-CM | POA: Diagnosis not present

## 2014-06-23 LAB — HCG, QUANTITATIVE, PREGNANCY: hCG, Beta Chain, Quant, S: 2742 m[IU]/mL — ABNORMAL HIGH (ref <5)

## 2014-06-23 NOTE — MAU Note (Signed)
Pt presents for follow up BHCG, denies pain or bleeding at this time.

## 2014-06-23 NOTE — MAU Provider Note (Signed)
S: 29 y.o. [id number] @[redacted]w[redacted]d  by LMP presents to MAU for repeat quant hcg.  She presented on 06/21/14 with abdominal pain and positive HPT.  She had quant hcg of 1300 and U/S with no IUP or ectopic visualized but thickened endometrium consistent with early pregnancy.  She denies pain or bleeding today.    O: BP 124/81 mmHg  Pulse 92  Temp(Src) 97.7 F (36.5 C) (Oral)  Resp 18  Wt 99.338 kg (219 lb)  SpO2 100%  LMP 05/11/2014  Physical Examination: General appearance - alert, well appearing, and in no distress, oriented to person, place, and time and acyanotic, in no respiratory distress  Results for orders placed or performed during the hospital encounter of 06/23/14 (from the past 48 hour(s))  hCG, quantitative, pregnancy     Status: Abnormal   Collection Time: 06/23/14  8:15 PM  Result Value Ref Range   hCG, Beta Chain, Quant, S 2742 (H) <5 mIU/mL    Comment:          GEST. AGE      CONC.  (mIU/mL)   <=1 WEEK        5 - 50     2 WEEKS       50 - 500     3 WEEKS       100 - 10,000     4 WEEKS     1,000 - 30,000     5 WEEKS     3,500 - 115,000   6-8 WEEKS     12,000 - 270,000    12 WEEKS     15,000 - 220,000        FEMALE AND NON-PREGNANT FEMALE:     LESS THAN 5 mIU/mL    A: Appropriate rise in hcg Abdominal pain in early pregancy  P: D/C home with ectopic precautions [facility].  Pt has appt in 2 weeks in office but Medicaid is pending currently. Return to MAU as needed for emergencies  Sharen CounterLisa Leftwich-Kirby Certified Nurse-Midwife

## 2014-06-23 NOTE — Discharge Instructions (Signed)
First Trimester of Pregnancy The first trimester of pregnancy is from week 1 until the end of week 12 (months 1 through 3). A week after a sperm fertilizes an egg, the egg will implant on the wall of the uterus. This embryo will begin to develop into a baby. Genes from you and your partner are forming the baby. The female genes determine whether the baby is a boy or a girl. At 6-8 weeks, the eyes and face are formed, and the heartbeat can be seen on ultrasound. At the end of 12 weeks, all the baby's organs are formed.  Now that you are pregnant, you will want to do everything you can to have a healthy baby. Two of the most important things are to get good prenatal care and to follow your health care provider's instructions. Prenatal care is all the medical care you receive before the baby's birth. This care will help prevent, find, and treat any problems during the pregnancy and childbirth. BODY CHANGES Your body goes through many changes during pregnancy. The changes vary from woman to woman.   You may gain or lose a couple of pounds at first.  You may feel sick to your stomach (nauseous) and throw up (vomit). If the vomiting is uncontrollable, call your health care provider.  You may tire easily.  You may develop headaches that can be relieved by medicines approved by your health care provider.  You may urinate more often. Painful urination may mean you have a bladder infection.  You may develop heartburn as a result of your pregnancy.  You may develop constipation because certain hormones are causing the muscles that push waste through your intestines to slow down.  You may develop hemorrhoids or swollen, bulging veins (varicose veins).  Your breasts may begin to grow larger and become tender. Your nipples may stick out more, and the tissue that surrounds them (areola) may become darker.  Your gums may bleed and may be sensitive to brushing and flossing.  Dark spots or blotches (chloasma,  mask of pregnancy) may develop on your face. This will likely fade after the baby is born.  Your menstrual periods will stop.  You may have a loss of appetite.  You may develop cravings for certain kinds of food.  You may have changes in your emotions from day to day, such as being excited to be pregnant or being concerned that something may go wrong with the pregnancy and baby.  You may have more vivid and strange dreams.  You may have changes in your hair. These can include thickening of your hair, rapid growth, and changes in texture. Some women also have hair loss during or after pregnancy, or hair that feels dry or thin. Your hair will most likely return to normal after your baby is born. WHAT TO EXPECT AT YOUR PRENATAL VISITS During a routine prenatal visit:  You will be weighed to make sure you and the baby are growing normally.  Your blood pressure will be taken.  Your abdomen will be measured to track your baby's growth.  The fetal heartbeat will be listened to starting around week 10 or 12 of your pregnancy.  Test results from any previous visits will be discussed. Your health care provider may ask you:  How you are feeling.  If you are feeling the baby move.  If you have had any abnormal symptoms, such as leaking fluid, bleeding, severe headaches, or abdominal cramping.  If you have any questions. Other tests   that may be performed during your first trimester include:  Blood tests to find your blood type and to check for the presence of any previous infections. They will also be used to check for low iron levels (anemia) and Rh antibodies. Later in the pregnancy, blood tests for diabetes will be done along with other tests if problems develop.  Urine tests to check for infections, diabetes, or protein in the urine.  An ultrasound to confirm the proper growth and development of the baby.  An amniocentesis to check for possible genetic problems.  Fetal screens for  spina bifida and Down syndrome.  You may need other tests to make sure you and the baby are doing well. HOME CARE INSTRUCTIONS  Medicines  Follow your health care provider's instructions regarding medicine use. Specific medicines may be either safe or unsafe to take during pregnancy.  Take your prenatal vitamins as directed.  If you develop constipation, try taking a stool softener if your health care provider approves. Diet  Eat regular, well-balanced meals. Choose a variety of foods, such as meat or vegetable-based protein, fish, milk and low-fat dairy products, vegetables, fruits, and whole grain breads and cereals. Your health care provider will help you determine the amount of weight gain that is right for you.  Avoid raw meat and uncooked cheese. These carry germs that can cause birth defects in the baby.  Eating four or five small meals rather than three large meals a day may help relieve nausea and vomiting. If you start to feel nauseous, eating a few soda crackers can be helpful. Drinking liquids between meals instead of during meals also seems to help nausea and vomiting.  If you develop constipation, eat more high-fiber foods, such as fresh vegetables or fruit and whole grains. Drink enough fluids to keep your urine clear or pale yellow. Activity and Exercise  Exercise only as directed by your health care provider. Exercising will help you:  Control your weight.  Stay in shape.  Be prepared for labor and delivery.  Experiencing pain or cramping in the lower abdomen or low back is a good sign that you should stop exercising. Check with your health care provider before continuing normal exercises.  Try to avoid standing for long periods of time. Move your legs often if you must stand in one place for a long time.  Avoid heavy lifting.  Wear low-heeled shoes, and practice good posture.  You may continue to have sex unless your health care provider directs you  otherwise. Relief of Pain or Discomfort  Wear a good support bra for breast tenderness.   Take warm sitz baths to soothe any pain or discomfort caused by hemorrhoids. Use hemorrhoid cream if your health care provider approves.   Rest with your legs elevated if you have leg cramps or low back pain.  If you develop varicose veins in your legs, wear support hose. Elevate your feet for 15 minutes, 3-4 times a day. Limit salt in your diet. Prenatal Care  Schedule your prenatal visits by the twelfth week of pregnancy. They are usually scheduled monthly at first, then more often in the last 2 months before delivery.  Write down your questions. Take them to your prenatal visits.  Keep all your prenatal visits as directed by your health care provider. Safety  Wear your seat belt at all times when driving.  Make a list of emergency phone numbers, including numbers for family, friends, the hospital, and police and fire departments. General Tips    Ask your health care provider for a referral to a local prenatal education class. Begin classes no later than at the beginning of month 6 of your pregnancy.  Ask for help if you have counseling or nutritional needs during pregnancy. Your health care provider can offer advice or refer you to specialists for help with various needs.  Do not use hot tubs, steam rooms, or saunas.  Do not douche or use tampons or scented sanitary pads.  Do not cross your legs for long periods of time.  Avoid cat litter boxes and soil used by cats. These carry germs that can cause birth defects in the baby and possibly loss of the fetus by miscarriage or stillbirth.  Avoid all smoking, herbs, alcohol, and medicines not prescribed by your health care provider. Chemicals in these affect the formation and growth of the baby.  Schedule a dentist appointment. At home, brush your teeth with a soft toothbrush and be gentle when you floss. SEEK MEDICAL CARE IF:   You have  dizziness.  You have mild pelvic cramps, pelvic pressure, or nagging pain in the abdominal area.  You have persistent nausea, vomiting, or diarrhea.  You have a bad smelling vaginal discharge.  You have pain with urination.  You notice increased swelling in your face, hands, legs, or ankles. SEEK IMMEDIATE MEDICAL CARE IF:   You have a fever.  You are leaking fluid from your vagina.  You have spotting or bleeding from your vagina.  You have severe abdominal cramping or pain.  You have rapid weight gain or loss.  You vomit blood or material that looks like coffee grounds.  You are exposed to German measles and have never had them.  You are exposed to fifth disease or chickenpox.  You develop a severe headache.  You have shortness of breath.  You have any kind of trauma, such as from a fall or a car accident. Document Released: 04/30/2001 Document Revised: 09/20/2013 Document Reviewed: 03/16/2013 [employer] Patient Information 2015 [employer]. This information is not intended to replace advice given to you by your health care provider. Make sure you discuss any questions you have with your health care provider.  

## 2014-06-30 ENCOUNTER — Ambulatory Visit (HOSPITAL_COMMUNITY)
Admission: RE | Admit: 2014-06-30 | Discharge: 2014-06-30 | Disposition: A | Discharge status: Home / Self Care | Payer: Medicaid Other | Source: Ambulatory Visit | Attending: Advanced Practice Midwife | Admitting: Advanced Practice Midwife

## 2014-06-30 DIAGNOSIS — R109 Unspecified abdominal pain: Secondary | ICD-10-CM | POA: Diagnosis not present

## 2014-06-30 DIAGNOSIS — O9989 Other specified diseases and conditions complicating pregnancy, childbirth and the puerperium: Secondary | ICD-10-CM | POA: Insufficient documentation

## 2014-06-30 DIAGNOSIS — O26899 Other specified pregnancy related conditions, unspecified trimester: Secondary | ICD-10-CM

## 2014-06-30 NOTE — MAU Provider Note (Signed)
Saidy Roseanne RenoStewart is a 29 y.o. G4P1021 at 5w 6d who is here today for a FU ultrasound. She denies any pain or bleeding today. She does not have any concerns at this time.  [place] Ob Transvaginal  06/30/2014   CLINICAL DATA:  Abdominal pain in pregnancy.  EXAM: TRANSVAGINAL OB ULTRASOUND  TECHNIQUE: Transvaginal ultrasound was performed for complete evaluation of the gestation as well as the maternal uterus, adnexal regions, and pelvic cul-de-sac.  COMPARISON:  06/21/2014  FINDINGS: Intrauterine gestational sac: Visualized/normal in shape.  Yolk sac:  Visualized  Embryo:  Visualized  Cardiac Activity: Visualized  Heart Rate: 100 bpm  CRL:   2  mm   5 w 6 d                  US EDC: 02/24/2015  Maternal uterus/adnexae: Retroverted uterus. Small subchorionic hemorrhage noted. Both ovaries are normal in appearance. No mass or free fluid identified.  IMPRESSION: Single living IUP measuring 5 weeks 6 days with US EDC of 02/24/2015.  Small subchorionic hemorrhage noted.   Electronically Signed   By: Myles RosenthalJohn  Stahl M.D.   On: 06/30/2014 15:08    Ultrasound reviewed with the patient. Pregnancy verification letter given, and US picture given to the patient. [facility].    First trimester precautions reviewed [facility]   Tawnya CrookHogan, Amaiah Cristiano Donovan 3:24 PM 06/30/2014

## 2014-07-02 ENCOUNTER — Inpatient Hospital Stay (HOSPITAL_COMMUNITY)
Admission: AD | Admit: 2014-07-02 | Discharge: 2014-07-02 | Disposition: A | Discharge status: Home / Self Care | Payer: Medicaid Other | Source: Ambulatory Visit | Attending: Obstetrics and Gynecology | Admitting: Obstetrics and Gynecology

## 2014-07-02 ENCOUNTER — Encounter (HOSPITAL_COMMUNITY): Payer: Self-pay

## 2014-07-02 DIAGNOSIS — O210 Mild hyperemesis gravidarum: Secondary | ICD-10-CM | POA: Insufficient documentation

## 2014-07-02 DIAGNOSIS — K529 Noninfective gastroenteritis and colitis, unspecified: Secondary | ICD-10-CM

## 2014-07-02 DIAGNOSIS — Z3A01 Less than 8 weeks gestation of pregnancy: Secondary | ICD-10-CM | POA: Diagnosis not present

## 2014-07-02 LAB — URINALYSIS, ROUTINE W REFLEX MICROSCOPIC
BILIRUBIN URINE: NEGATIVE
GLUCOSE, UA: NEGATIVE mg/dL
KETONES UR: 15 mg/dL — AB
Leukocytes, UA: NEGATIVE
Nitrite: NEGATIVE
PH: 6 (ref 5.0–8.0)
Protein, ur: NEGATIVE mg/dL
Specific Gravity, Urine: >1.03 — ABNORMAL HIGH (ref 1.005–1.030)
UROBILINOGEN UA: 0.2 mg/dL (ref 0.0–1.0)

## 2014-07-02 LAB — URINE MICROSCOPIC-ADD ON

## 2014-07-02 MED ORDER — PROMETHAZINE HCL 25 MG PO TABS: 25.0000 mg | ORAL_TABLET | Freq: Four times a day (QID) | ORAL | Status: DC | PRN

## 2014-07-02 NOTE — MAU Provider Note (Signed)
History     CSN: [id number]  Arrival date and time: 07/02/14 1809   None     Chief Complaint  Patient presents with  . Nausea  . Emesis  . Diarrhea  . Vaginal Bleeding   HPI This is a 29 y.o. female at [redacted]w[redacted]d who presents with c/o Nausea, vomiting, diarrhea and lower abdominal pain which occurs right before the diarrhea. Had one episode of red bleeding. Admits to IC yesterday. Works in a day care   RN Note:  Expand All Collapse All   Pt presents complaining of n/v/d and pain in her abdomen before she has diarrhea and the pain is worse with eating. States she also had some bright red vaginal bleeding when wiping. Last intercourse yesterday.        OB History    Gravida Para Term Preterm AB TAB SAB Ectopic Multiple Living   Past Medical History  Diagnosis Date  . No pertinent past medical history   . Anemia     Past Surgical History  Procedure Laterality Date  . Vaginal polyps removed    . Vaginal poloyps    . Cesarean section  12/04/2010    Procedure: CESAREAN SECTION;  Surgeon: Oliver Pila;  Location: [facility];  Service: Gynecology;  Laterality: N/A;  . Cesarean section      Family History  Problem Relation Age of Onset  . Heart disease Maternal Grandmother   . Heart disease Maternal Grandfather   . Diabetes Paternal Grandfather   . Diabetes Paternal Grandmother     History  Substance Use Topics  . Smoking status: Never Smoker   . Smokeless tobacco: Not on file  . Alcohol Use: No    Allergies: No Known Allergies  Prescriptions prior to admission  Medication Sig Dispense Refill Last Dose  . Prenatal Vit-Fe Fumarate-FA (PRENATAL MULTIVITAMIN) TABS tablet Take 1 tablet by mouth daily at 12 noon.   10/18/2013 at Unknown time    Review of Systems  Constitutional: Positive for malaise/fatigue. Negative for fever and chills.  Gastrointestinal: Positive for nausea, vomiting, abdominal pain and diarrhea. Negative for constipation.   Genitourinary:       One episode of bleeding  Neurological: Negative for dizziness.   Physical Exam   Blood pressure 108/64, pulse 106, temperature 98 F (36.7 C), temperature source Oral, resp. rate 18, last menstrual period 05/11/2014, unknown if currently breastfeeding.  Physical Exam  Constitutional: She is oriented to person, place, and time. She appears well-developed and well-nourished. No distress.  HENT:  Head: Normocephalic.  Cardiovascular: Normal rate, regular rhythm and normal heart sounds.  Exam reveals no gallop and no friction rub.   No murmur heard. Respiratory: Effort normal.  GI: Soft. She exhibits no distension. There is tenderness (diffuse, mild tenderness throughout). There is no rebound and no guarding.  Genitourinary: Vagina normal. No vaginal discharge (no blood seen, cervix closed) found.  Musculoskeletal: Normal range of motion.  Neurological: She is alert and oriented to person, place, and time.  Skin: Skin is warm and dry.  Psychiatric: She has a normal mood and affect.    MAU Course  Procedures  MDM Results for orders placed or performed during the hospital encounter of 07/02/14 (from the past 72 hour(s))  Urinalysis, Routine w reflex microscopic     Status: Abnormal   Collection Time: 07/02/14  6:30 PM  Result Value Ref Range   Color,  Urine YELLOW YELLOW   APPearance CLEAR CLEAR   Specific Gravity, Urine >1.030 (H) 1.005 - 1.030   pH 6.0 5.0 - 8.0   Glucose, UA NEGATIVE NEGATIVE mg/dL   Hgb urine dipstick TRACE (A) NEGATIVE   Bilirubin Urine NEGATIVE NEGATIVE   Ketones, ur 15 (A) NEGATIVE mg/dL   Protein, ur NEGATIVE NEGATIVE mg/dL   Urobilinogen, UA 0.2 0.0 - 1.0 mg/dL   Nitrite NEGATIVE NEGATIVE   Leukocytes, UA NEGATIVE NEGATIVE  Urine microscopic-add on     Status: Abnormal   Collection Time: 07/02/14  6:30 PM  Result Value Ref Range   Squamous Epithelial / LPF FEW (A) RARE   WBC, UA 0-2 <3 WBC/hpf   RBC / HPF 0-2 <3 RBC/hpf    Bacteria, UA FEW (A) RARE    Assessment and Plan  A:  SIUP at 638w5d        Normal fetal ultrasound 2 days ago       No current bleeding       Probable gastroenteritis  P:  Discussed supportive care for gastroenteritis       Rx Phenergan       Discussed we don't usually use antidiarrheal meds unless diarrhea is uncontrollable or persistent       Follow up or return if persists longer than a few days.        Followup in office  [facility],Cierra Rothgeb 07/02/2014, 6:59 PM

## 2014-07-02 NOTE — Discharge Instructions (Signed)

## 2014-07-02 NOTE — MAU Note (Signed)
Pt presents complaining of n/v/d and pain in her abdomen before she has diarrhea and the pain is worse with eating. States she also had some bright red vaginal bleeding when wiping. Last intercourse yesterday.

## 2014-08-10 LAB — OB RESULTS CONSOLE RPR
RPR: NONREACTIVE
RPR: NONREACTIVE

## 2014-08-10 LAB — OB RESULTS CONSOLE GC/CHLAMYDIA
CHLAMYDIA, DNA PROBE: NEGATIVE
CHLAMYDIA, DNA PROBE: NEGATIVE
Gonorrhea: NEGATIVE
Gonorrhea: NEGATIVE

## 2014-08-10 LAB — OB RESULTS CONSOLE ABO/RH: RH Type: POSITIVE

## 2014-08-10 LAB — OB RESULTS CONSOLE HIV ANTIBODY (ROUTINE TESTING)
HIV: NONREACTIVE
HIV: NONREACTIVE

## 2014-08-10 LAB — OB RESULTS CONSOLE HEPATITIS B SURFACE ANTIGEN: HEP B S AG: NEGATIVE

## 2014-08-10 LAB — OB RESULTS CONSOLE ANTIBODY SCREEN: Antibody Screen: NEGATIVE

## 2014-08-10 LAB — OB RESULTS CONSOLE RUBELLA ANTIBODY, IGM: Rubella: IMMUNE

## 2015-01-23 ENCOUNTER — Inpatient Hospital Stay (HOSPITAL_COMMUNITY)
Admission: AD | Admit: 2015-01-23 | Discharge: 2015-01-23 | Disposition: A | Discharge status: Home / Self Care | Payer: Medicaid Other | Source: Ambulatory Visit | Attending: Obstetrics and Gynecology | Admitting: Obstetrics and Gynecology

## 2015-01-23 ENCOUNTER — Encounter (HOSPITAL_COMMUNITY): Payer: Self-pay | Admitting: *Deleted

## 2015-01-23 DIAGNOSIS — Z3493 Encounter for supervision of normal pregnancy, unspecified, third trimester: Secondary | ICD-10-CM | POA: Diagnosis not present

## 2015-01-23 LAB — URINALYSIS, ROUTINE W REFLEX MICROSCOPIC
BILIRUBIN URINE: NEGATIVE
Glucose, UA: NEGATIVE mg/dL
HGB URINE DIPSTICK: NEGATIVE
Ketones, ur: NEGATIVE mg/dL
Leukocytes, UA: NEGATIVE
NITRITE: NEGATIVE
Protein, ur: NEGATIVE mg/dL
Specific Gravity, Urine: 1.015 (ref 1.005–1.030)
Urobilinogen, UA: 0.2 mg/dL (ref 0.0–1.0)
pH: 6.5 (ref 5.0–8.0)

## 2015-01-23 LAB — POCT FERN TEST: POCT Fern Test: NEGATIVE

## 2015-01-23 NOTE — Discharge Instructions (Signed)
Fetal Movement Counts °Patient Name: __________________________________________________ Patient Due Date: ____________________ °Performing a fetal movement count is highly recommended in high-risk pregnancies, but it is good for every pregnant woman to do. Your health care provider may ask you to start counting fetal movements at 28 weeks of the pregnancy. Fetal movements often increase: °· After eating a full meal. °· After physical activity. °· After eating or drinking something sweet or cold. °· At rest. °Pay attention to when you feel the baby is most active. This will help you notice a pattern of your baby's sleep and wake cycles and what factors contribute to an increase in fetal movement. It is important to perform a fetal movement count at the same time each day when your baby is normally most active.  °HOW TO COUNT FETAL MOVEMENTS °1. Find a quiet and comfortable area to sit or lie down on your left side. Lying on your left side provides the best blood and oxygen circulation to your baby. °2. Write down the day and time on a sheet of paper or in a journal. °3. Start counting kicks, flutters, swishes, rolls, or jabs in a 2-hour period. You should feel at least 10 movements within 2 hours. °4. If you do not feel 10 movements in 2 hours, wait 2-3 hours and count again. Look for a change in the pattern or not enough counts in 2 hours. °SEEK MEDICAL CARE IF: °· You feel less than 10 counts in 2 hours, tried twice. °· There is no movement in over an hour. °· The pattern is changing or taking longer each day to reach 10 counts in 2 hours. °· You feel the baby is not moving as he or she usually does. °Date: ____________ Movements: ____________ Start time: ____________ Finish time: ____________  °Date: ____________ Movements: ____________ Start time: ____________ Finish time: ____________ °Date: ____________ Movements: ____________ Start time: ____________ Finish time: ____________ °Date: ____________ Movements:  ____________ Start time: ____________ Finish time: ____________ °Date: ____________ Movements: ____________ Start time: ____________ Finish time: ____________ °Date: ____________ Movements: ____________ Start time: ____________ Finish time: ____________ °Date: ____________ Movements: ____________ Start time: ____________ Finish time: ____________ °Date: ____________ Movements: ____________ Start time: ____________ Finish time: ____________  °Date: ____________ Movements: ____________ Start time: ____________ Finish time: ____________ °Date: ____________ Movements: ____________ Start time: ____________ Finish time: ____________ °Date: ____________ Movements: ____________ Start time: ____________ Finish time: ____________ °Date: ____________ Movements: ____________ Start time: ____________ Finish time: ____________ °Date: ____________ Movements: ____________ Start time: ____________ Finish time: ____________ °Date: ____________ Movements: ____________ Start time: ____________ Finish time: ____________ °Date: ____________ Movements: ____________ Start time: ____________ Finish time: ____________  °Date: ____________ Movements: ____________ Start time: ____________ Finish time: ____________ °Date: ____________ Movements: ____________ Start time: ____________ Finish time: ____________ °Date: ____________ Movements: ____________ Start time: ____________ Finish time: ____________ °Date: ____________ Movements: ____________ Start time: ____________ Finish time: ____________ °Date: ____________ Movements: ____________ Start time: ____________ Finish time: ____________ °Date: ____________ Movements: ____________ Start time: ____________ Finish time: ____________ °Date: ____________ Movements: ____________ Start time: ____________ Finish time: ____________  °Date: ____________ Movements: ____________ Start time: ____________ Finish time: ____________ °Date: ____________ Movements: ____________ Start time: ____________ Finish  time: ____________ °Date: ____________ Movements: ____________ Start time: ____________ Finish time: ____________ °Date: ____________ Movements: ____________ Start time: ____________ Finish time: ____________ °Date: ____________ Movements: ____________ Start time: ____________ Finish time: ____________ °Date: ____________ Movements: ____________ Start time: ____________ Finish time: ____________ °Date: ____________ Movements: ____________ Start time: ____________ Finish time: ____________  °Date: ____________ Movements: ____________ Start time: ____________ Finish   time: ____________ Date: ____________ Movements: ____________ Start time: ____________ Doreatha Martin time: ____________ Date: ____________ Movements: ____________ Start time: ____________ Doreatha Martin time: ____________ Date: ____________ Movements: ____________ Start time: ____________ Doreatha Martin time: ____________ Date: ____________ Movements: ____________ Start time: ____________ Doreatha Martin time: ____________ Date: ____________ Movements: ____________ Start time: ____________ Doreatha Martin time: ____________ Date: ____________ Movements: ____________ Start time: ____________ Doreatha Martin time: ____________  Date: ____________ Movements: ____________ Start time: ____________ Doreatha Martin time: ____________ Date: ____________ Movements: ____________ Start time: ____________ Doreatha Martin time: ____________ Date: ____________ Movements: ____________ Start time: ____________ Doreatha Martin time: ____________ Date: ____________ Movements: ____________ Start time: ____________ Doreatha Martin time: ____________ Date: ____________ Movements: ____________ Start time: ____________ Doreatha Martin time: ____________ Date: ____________ Movements: ____________ Start time: ____________ Doreatha Martin time: ____________ Date: ____________ Movements: ____________ Start time: ____________ Doreatha Martin time: ____________  Date: ____________ Movements: ____________ Start time: ____________ Doreatha Martin time: ____________ Date: ____________  Movements: ____________ Start time: ____________ Doreatha Martin time: ____________ Date: ____________ Movements: ____________ Start time: ____________ Doreatha Martin time: ____________ Date: ____________ Movements: ____________ Start time: ____________ Doreatha Martin time: ____________ Date: ____________ Movements: ____________ Start time: ____________ Doreatha Martin time: ____________ Date: ____________ Movements: ____________ Start time: ____________ Doreatha Martin time: ____________ Date: ____________ Movements: ____________ Start time: ____________ Doreatha Martin time: ____________  Date: ____________ Movements: ____________ Start time: ____________ Doreatha Martin time: ____________ Date: ____________ Movements: ____________ Start time: ____________ Doreatha Martin time: ____________ Date: ____________ Movements: ____________ Start time: ____________ Doreatha Martin time: ____________ Date: ____________ Movements: ____________ Start time: ____________ Doreatha Martin time: ____________ Date: ____________ Movements: ____________ Start time: ____________ Doreatha Martin time: ____________ Date: ____________ Movements: ____________ Start time: ____________ Doreatha Martin time: ____________ Document Released: 06/05/2006 Document Revised: 09/20/2013 Document Reviewed: 03/02/2012 [employer] Patient Information 2015 [employer]. This information is not intended to replace advice given to you by your health care provider. Make sure you discuss any questions you have with your health care provider. Hypertension During Pregnancy Hypertension, or high blood pressure, is when there is extra pressure inside your blood vessels that carry blood from the heart to the rest of your body (arteries). It can happen at any time in life, including pregnancy. Hypertension during pregnancy can cause problems for you and your baby. Your baby might not weigh as much as he or she should at birth or might be born early (premature). Very bad cases of hypertension during pregnancy can be life-threatening.  Different types  of hypertension can occur during pregnancy. These include:  Chronic hypertension. This happens when a woman has hypertension before pregnancy and it continues during pregnancy.  Gestational hypertension. This is when hypertension develops during pregnancy.  Preeclampsia or toxemia of pregnancy. This is a very serious type of hypertension that develops only during pregnancy. It affects the whole body and can be very dangerous for both mother and baby.  Gestational hypertension and preeclampsia usually go away after your baby is born. Your blood pressure will likely stabilize within 6 weeks. Women who have hypertension during pregnancy have a greater chance of developing hypertension later in life or with future pregnancies. RISK FACTORS There are certain factors that make it more likely for you to develop hypertension during pregnancy. These include: 5. Having hypertension before pregnancy. 6. Having hypertension during a previous pregnancy. 7. Being overweight. 8. Being older than 40 years. 9. Being pregnant with more than one baby. 10. Having diabetes or kidney problems. SIGNS AND SYMPTOMS Chronic and gestational hypertension rarely cause symptoms. Preeclampsia has symptoms, which may include:  Increased protein in your urine. Your health care provider will check for this at every prenatal visit.  Swelling of your  hands and face.  Rapid weight gain.  Headaches.  Visual changes.  Being bothered by light.  Abdominal pain, especially in the upper right area.  Chest pain.  Shortness of breath.  Increased reflexes.  Seizures. These occur with a more severe form of preeclampsia, called eclampsia. DIAGNOSIS  You may be diagnosed with hypertension during a regular prenatal exam. At each prenatal visit, you may have:  Your blood pressure checked.  A urine test to check for protein in your urine. The type of hypertension you are diagnosed with depends on when you developed it.  It also depends on your specific blood pressure reading.  Developing hypertension before 20 weeks of pregnancy is consistent with chronic hypertension.  Developing hypertension after 20 weeks of pregnancy is consistent with gestational hypertension.  Hypertension with increased urinary protein is diagnosed as preeclampsia.  Blood pressure measurements that stay above 160 systolic or 110 diastolic are a sign of severe preeclampsia. TREATMENT Treatment for hypertension during pregnancy varies. Treatment depends on the type of hypertension and how serious it is.  If you take medicine for chronic hypertension, you may need to switch medicines.  Medicines called ACE inhibitors should not be taken during pregnancy.  Low-dose aspirin may be suggested for women who have risk factors for preeclampsia.  If you have gestational hypertension, you may need to take a blood pressure medicine that is safe during pregnancy. Your health care provider will recommend the correct medicine.  If you have severe preeclampsia, you may need to be in the hospital. Health care providers will watch you and your baby very closely. You also may need to take medicine called magnesium sulfate to prevent seizures and lower blood pressure.  Sometimes, an early delivery is needed. This may be the case if the condition worsens. It would be done to protect you and your baby. The only cure for preeclampsia is delivery.  Your health care provider may recommend that you take one low-dose aspirin (81 mg) each day to help prevent high blood pressure during your pregnancy if you are at risk for preeclampsia. You may be at risk for preeclampsia if:  You had preeclampsia or eclampsia during a previous pregnancy.  Your baby did not grow as expected during a previous pregnancy.  You experienced preterm birth with a previous pregnancy.  You experienced a separation of the placenta from the uterus (placental abruption) during a  previous pregnancy.  You experienced the loss of your baby during a previous pregnancy.  You are pregnant with more than one baby.  You have other medical conditions, such as diabetes or an autoimmune disease. HOME CARE INSTRUCTIONS  Schedule and keep all of your regular prenatal care appointments. This is important.  Take medicines only as directed by your health care provider. Tell your health care provider about all medicines you take.  Eat as little salt as possible.  Get regular exercise.  Do not drink alcohol.  Do not use tobacco products.  Do not drink products with caffeine.  Lie on your left side when resting. SEEK IMMEDIATE MEDICAL CARE IF:  You have severe abdominal pain.  You have sudden swelling in your hands, ankles, or face.  You gain 4 pounds (1.8 kg) or more in 1 week.  You vomit repeatedly.  You have vaginal bleeding.  You do not feel your baby moving as much.  You have a headache.  You have blurred or double vision.  You have muscle twitching or spasms.  You have shortness  of breath.  You have blue fingernails or lips.  You have blood in your urine. MAKE SURE YOU:  Understand these instructions.  Will watch your condition.  Will get help right away if you are not doing well or get worse. Document Released: 01/22/2011 Document Revised: 09/20/2013 Document Reviewed: 12/03/2012 [facility] Patient Information 2015 [place], [place]. This information is not intended to replace advice given to you by your health care provider. Make sure you discuss any questions you have with your health care provider. Braxton Hicks Contractions Contractions of the uterus can occur throughout pregnancy. Contractions are not always a sign that you are in labor.  WHAT ARE BRAXTON HICKS CONTRACTIONS?  Contractions that occur before labor are called Braxton Hicks contractions, or false labor. Toward the end of pregnancy (32-34 weeks), these contractions can develop  more often and may become more forceful. This is not true labor because these contractions do not result in opening (dilatation) and thinning of the cervix. They are sometimes difficult to tell apart from true labor because these contractions can be forceful and people have different pain tolerances. You should not feel embarrassed if you go to the hospital with false labor. Sometimes, the only way to tell if you are in true labor is for your health care provider to look for changes in the cervix. If there are no prenatal problems or other health problems associated with the pregnancy, it is completely safe to be sent home with false labor and await the onset of true labor. HOW CAN YOU TELL THE DIFFERENCE BETWEEN TRUE AND FALSE LABOR? False Labor  The contractions of false labor are usually shorter and not as hard as those of true labor.   The contractions are usually irregular.   The contractions are often felt in the front of the lower abdomen and in the groin.   The contractions may go away when you walk around or change positions while lying down.   The contractions get weaker and are shorter lasting as time goes on.   The contractions do not usually become progressively stronger, regular, and closer together as with true labor.  True Labor 11. Contractions in true labor last 30-70 seconds, become very regular, usually become more intense, and increase in frequency.  12. The contractions do not go away with walking.  13. The discomfort is usually felt in the top of the uterus and spreads to the lower abdomen and low back.  14. True labor can be determined by your health care provider with an exam. This will show that the cervix is dilating and getting thinner.  WHAT TO REMEMBER  Keep up with your usual exercises and follow other instructions given by your health care provider.   Take medicines as directed by your health care provider.   Keep your regular prenatal  appointments.   Eat and drink lightly if you think you are going into labor.   If Braxton Hicks contractions are making you uncomfortable:   Change your position from lying down or resting to walking, or from walking to resting.   Sit and rest in a tub of warm water.   Drink 2-3 glasses of water. Dehydration may cause these contractions.   Do slow and deep breathing several times an hour.  WHEN SHOULD I SEEK IMMEDIATE MEDICAL CARE? Seek immediate medical care if:  Your contractions become stronger, more regular, and closer together.   You have fluid leaking or gushing from your vagina.   You have a fever.  You pass blood-tinged mucus.   You have vaginal bleeding.   You have continuous abdominal pain.   You have low back pain that you never had before.   You feel your baby's head pushing down and causing pelvic pressure.   Your baby is not moving as much as it used to.  Document Released: 05/06/2005 Document Revised: 05/11/2013 Document Reviewed: 02/15/2013 [facility] Patient Information 2015 [place], [place]. This information is not intended to replace advice given to you by your health care provider. Make sure you discuss any questions you have with your health care provider.

## 2015-01-23 NOTE — MAU Note (Signed)
Pt states for the last few days she has had backpain, abd tightness, and pressure. Denies bleeding. States she has had a watery discharge but thinks it is just an increase in vaginal discharge.

## 2015-02-05 ENCOUNTER — Inpatient Hospital Stay (HOSPITAL_COMMUNITY)
Admission: AD | Admit: 2015-02-05 | Discharge: 2015-02-05 | Disposition: A | Discharge status: Home / Self Care | Payer: Medicaid Other | Source: Ambulatory Visit | Attending: Obstetrics and Gynecology | Admitting: Obstetrics and Gynecology

## 2015-02-05 ENCOUNTER — Inpatient Hospital Stay (HOSPITAL_COMMUNITY): Payer: Self-pay | Admitting: *Deleted

## 2015-02-05 DIAGNOSIS — Z3493 Encounter for supervision of normal pregnancy, unspecified, third trimester: Secondary | ICD-10-CM | POA: Insufficient documentation

## 2015-02-05 LAB — URINALYSIS, ROUTINE W REFLEX MICROSCOPIC
Bilirubin Urine: NEGATIVE
GLUCOSE, UA: NEGATIVE mg/dL
Hgb urine dipstick: NEGATIVE
Ketones, ur: NEGATIVE mg/dL
LEUKOCYTES UA: NEGATIVE
NITRITE: NEGATIVE
PH: 6.5 (ref 5.0–8.0)
Protein, ur: NEGATIVE mg/dL
SPECIFIC GRAVITY, URINE: 1.02 (ref 1.005–1.030)
Urobilinogen, UA: 0.2 mg/dL (ref 0.0–1.0)

## 2015-02-05 LAB — AMNISURE RUPTURE OF MEMBRANE (ROM) NOT AT ARMC: Amnisure ROM: NEGATIVE

## 2015-02-05 NOTE — MAU Note (Signed)
PT  SAYS SHE WOKE  AT 0300  AND  HER UNDERWEAR  AND  THIGHS  WERE  WET   VE IN MAU -  FT.     DENIES HSV AND MRSA.  GBS- POSITIVE

## 2015-02-05 NOTE — Discharge Instructions (Signed)
F

## 2015-02-16 ENCOUNTER — Encounter (HOSPITAL_COMMUNITY): Payer: Self-pay

## 2015-02-16 ENCOUNTER — Encounter (HOSPITAL_COMMUNITY)
Admission: RE | Admit: 2015-02-16 | Discharge: 2015-02-16 | Disposition: A | Discharge status: Home / Self Care | Payer: Medicaid Other | Source: Ambulatory Visit | Attending: Obstetrics and Gynecology | Admitting: Obstetrics and Gynecology

## 2015-02-16 DIAGNOSIS — Z01818 Encounter for other preprocedural examination: Secondary | ICD-10-CM | POA: Diagnosis present

## 2015-02-16 LAB — CBC
HCT: 30.6 % — ABNORMAL LOW (ref 36.0–46.0)
Hemoglobin: 10.1 g/dL — ABNORMAL LOW (ref 12.0–15.0)
MCH: 29.4 pg (ref 26.0–34.0)
MCHC: 33 g/dL (ref 30.0–36.0)
MCV: 89.2 fL (ref 78.0–100.0)
Platelets: 166 10*3/uL (ref 150–400)
RBC: 3.43 MIL/uL — ABNORMAL LOW (ref 3.87–5.11)
RDW: 15.3 % (ref 11.5–15.5)
WBC: 4.7 10*3/uL (ref 4.0–10.5)

## 2015-02-16 LAB — TYPE AND SCREEN
ABO/RH(D): B POS
Antibody Screen: NEGATIVE

## 2015-02-16 LAB — ABO/RH: ABO/RH(D): B POS

## 2015-02-16 NOTE — Patient Instructions (Addendum)
   Your procedure is scheduled on: OCT 1 AT 9AM  Enter through the Main Entrance of [facility] at  730AM  Pick up the phone at the desk and dial [phone] and inform us of your arrival.  Please call this number if you have any problems the morning of surgery: [phone]  Remember: DO NOT EAT OR DRINK LIQUIDS AFTER SEPT 30    Do not wear jewelry,  No metal in your hair or on your body. Do not wear lotions, powders, perfumes.  You may wear deodorant.  Do not bring valuables to the hospital. Contacts, dentures or bridgework may not be worn into surgery.  Leave suitcase in the car. After Surgery it may be brought to your room. For patients being admitted to the hospital, checkout time is 11:00am the day of discharge.

## 2015-02-17 ENCOUNTER — Encounter (HOSPITAL_COMMUNITY): Payer: Self-pay | Admitting: Anesthesiology

## 2015-02-17 LAB — RPR: RPR Ser Ql: NONREACTIVE

## 2015-02-17 NOTE — Anesthesia Preprocedure Evaluation (Addendum)
Anesthesia Evaluation  Patient identified by MRN, date of birth, ID band Patient awake    Reviewed: Allergy & Precautions, NPO status , Patient's Chart, lab work & pertinent test results  Airway Mallampati: I  TM Distance: >3 FB Neck ROM: Full    Dental no notable dental hx. (+) Teeth Intact   Pulmonary neg pulmonary ROS,    Pulmonary exam normal breath sounds clear to auscultation       Cardiovascular negative cardio ROS Normal cardiovascular exam Rhythm:Regular Rate:Normal     Neuro/Psych negative neurological ROS  negative psych ROS   GI/Hepatic Neg liver ROS, GERD  ,  Endo/Other  Morbid obesity  Renal/GU negative Renal ROS  negative genitourinary   Musculoskeletal negative musculoskeletal ROS (+)   Abdominal (+) + obese,   Peds  Hematology  (+) anemia ,   Anesthesia Other Findings   Reproductive/Obstetrics (+) Pregnancy Previous C/Section x2                            Anesthesia Physical Anesthesia Plan  ASA: III  Anesthesia Plan: Spinal   Post-op Pain Management:    Induction:   Airway Management Planned: Natural Airway  Additional Equipment:   Intra-op Plan:   Post-operative Plan:   Informed Consent: I have reviewed the patients History and Physical, chart, labs and discussed the procedure including the risks, benefits and alternatives for the proposed anesthesia with the patient or authorized representative who has indicated his/her understanding and acceptance.     Plan Discussed with: Anesthesiologist, CRNA and Surgeon  Anesthesia Plan Comments:         Anesthesia Quick Evaluation

## 2015-02-18 ENCOUNTER — Inpatient Hospital Stay (HOSPITAL_COMMUNITY): Payer: Medicaid Other | Admitting: Anesthesiology

## 2015-02-18 ENCOUNTER — Inpatient Hospital Stay (HOSPITAL_COMMUNITY)
Admission: RE | Admit: 2015-02-18 | Discharge: 2015-02-21 | DRG: 766 | Disposition: A | Discharge status: Home / Self Care | Payer: Medicaid Other | Source: Ambulatory Visit | Attending: Obstetrics and Gynecology | Admitting: Obstetrics and Gynecology

## 2015-02-18 ENCOUNTER — Encounter (HOSPITAL_COMMUNITY)
Admission: RE | Disposition: A | Discharge status: Home / Self Care | Payer: Self-pay | Source: Ambulatory Visit | Attending: Obstetrics and Gynecology

## 2015-02-18 ENCOUNTER — Encounter (HOSPITAL_COMMUNITY): Payer: Self-pay

## 2015-02-18 DIAGNOSIS — O480 Post-term pregnancy: Principal | ICD-10-CM | POA: Diagnosis present

## 2015-02-18 DIAGNOSIS — O99824 Streptococcus B carrier state complicating childbirth: Secondary | ICD-10-CM | POA: Diagnosis present

## 2015-02-18 DIAGNOSIS — O9902 Anemia complicating childbirth: Secondary | ICD-10-CM | POA: Diagnosis present

## 2015-02-18 DIAGNOSIS — O34211 Maternal care for low transverse scar from previous cesarean delivery: Secondary | ICD-10-CM | POA: Diagnosis present

## 2015-02-18 DIAGNOSIS — Z3A39 39 weeks gestation of pregnancy: Secondary | ICD-10-CM | POA: Diagnosis not present

## 2015-02-18 DIAGNOSIS — O9962 Diseases of the digestive system complicating childbirth: Secondary | ICD-10-CM | POA: Diagnosis present

## 2015-02-18 DIAGNOSIS — K219 Gastro-esophageal reflux disease without esophagitis: Secondary | ICD-10-CM | POA: Diagnosis present

## 2015-02-18 DIAGNOSIS — O99214 Obesity complicating childbirth: Secondary | ICD-10-CM | POA: Diagnosis present

## 2015-02-18 DIAGNOSIS — Z98891 History of uterine scar from previous surgery: Secondary | ICD-10-CM

## 2015-02-18 DIAGNOSIS — Z6839 Body mass index (BMI) 39.0-39.9, adult: Secondary | ICD-10-CM | POA: Diagnosis not present

## 2015-02-18 SURGERY — Surgical Case
Anesthesia: Spinal | Site: Abdomen

## 2015-02-18 MED ORDER — NALBUPHINE HCL 10 MG/ML IJ SOLN
5.0000 mg | INTRAMUSCULAR | Status: DC | PRN
  Filled 2015-02-18: qty 0.5

## 2015-02-18 MED ORDER — ONDANSETRON HCL 4 MG/2ML IJ SOLN
INTRAMUSCULAR | Status: AC
  Filled 2015-02-18: qty 2

## 2015-02-18 MED ORDER — SODIUM CHLORIDE 0.9 % IJ SOLN: 3.0000 mL | INTRAMUSCULAR | Status: DC | PRN

## 2015-02-18 MED ORDER — SCOPOLAMINE 1 MG/3DAYS TD PT72
1.0000 | MEDICATED_PATCH | Freq: Once | TRANSDERMAL | Status: DC
  Administered 2015-02-18: 1.5 mg via TRANSDERMAL

## 2015-02-18 MED ORDER — WITCH HAZEL-GLYCERIN EX PADS: 1.0000 "application " | MEDICATED_PAD | CUTANEOUS | Status: DC | PRN

## 2015-02-18 MED ORDER — ONDANSETRON HCL 4 MG/2ML IJ SOLN: 4.0000 mg | Freq: Three times a day (TID) | INTRAMUSCULAR | Status: DC | PRN

## 2015-02-18 MED ORDER — ZOLPIDEM TARTRATE 5 MG PO TABS: 5.0000 mg | ORAL_TABLET | Freq: Every evening | ORAL | Status: DC | PRN

## 2015-02-18 MED ORDER — IBUPROFEN 600 MG PO TABS
600.0000 mg | ORAL_TABLET | Freq: Four times a day (QID) | ORAL | Status: DC
  Administered 2015-02-18 – 2015-02-21 (×11): 600 mg via ORAL
  Filled 2015-02-18 (×11): qty 1

## 2015-02-18 MED ORDER — DIBUCAINE 1 % RE OINT: 1.0000 "application " | TOPICAL_OINTMENT | RECTAL | Status: DC | PRN

## 2015-02-18 MED ORDER — MAGNESIUM HYDROXIDE 400 MG/5ML PO SUSP: 30.0000 mL | ORAL | Status: DC | PRN

## 2015-02-18 MED ORDER — CEFAZOLIN SODIUM-DEXTROSE 2-3 GM-% IV SOLR
2.0000 g | INTRAVENOUS | Status: AC
  Administered 2015-02-18: 2 g via INTRAVENOUS

## 2015-02-18 MED ORDER — NALOXONE HCL 1 MG/ML IJ SOLN
1.0000 ug/kg/h | INTRAVENOUS | Status: DC | PRN
  Filled 2015-02-18: qty 2

## 2015-02-18 MED ORDER — OXYCODONE-ACETAMINOPHEN 5-325 MG PO TABS
1.0000 | ORAL_TABLET | ORAL | Status: DC | PRN
  Administered 2015-02-19: 2 via ORAL
  Administered 2015-02-19 – 2015-02-20 (×5): 1 via ORAL
  Administered 2015-02-20: 2 via ORAL
  Administered 2015-02-20: 1 via ORAL
  Filled 2015-02-18: qty 2
  Filled 2015-02-18: qty 1
  Filled 2015-02-18: qty 2
  Filled 2015-02-18 (×5): qty 1

## 2015-02-18 MED ORDER — INFLUENZA VAC SPLIT QUAD 0.5 ML IM SUSY
0.5000 mL | PREFILLED_SYRINGE | INTRAMUSCULAR | Status: AC
Start: 2015-02-19 — End: 2015-02-19
  Administered 2015-02-19: 0.5 mL via INTRAMUSCULAR

## 2015-02-18 MED ORDER — ONDANSETRON HCL 4 MG/2ML IJ SOLN
INTRAMUSCULAR | Status: DC | PRN
  Administered 2015-02-18: 4 mg via INTRAVENOUS

## 2015-02-18 MED ORDER — CEFAZOLIN SODIUM-DEXTROSE 2-3 GM-% IV SOLR
INTRAVENOUS | Status: AC
  Filled 2015-02-18: qty 50

## 2015-02-18 MED ORDER — FENTANYL CITRATE (PF) 100 MCG/2ML IJ SOLN
INTRAMUSCULAR | Status: AC
  Filled 2015-02-18: qty 4

## 2015-02-18 MED ORDER — PRENATAL MULTIVITAMIN CH
1.0000 | ORAL_TABLET | Freq: Every day | ORAL | Status: DC
  Administered 2015-02-19 – 2015-02-20 (×2): 1 via ORAL
  Filled 2015-02-18 (×2): qty 1

## 2015-02-18 MED ORDER — FENTANYL CITRATE (PF) 100 MCG/2ML IJ SOLN
INTRAMUSCULAR | Status: DC | PRN
  Administered 2015-02-18: 20 ug via INTRATHECAL

## 2015-02-18 MED ORDER — MORPHINE SULFATE (PF) 0.5 MG/ML IJ SOLN
INTRAMUSCULAR | Status: AC
Start: 2015-02-18 — End: 2015-02-18
  Filled 2015-02-18: qty 100

## 2015-02-18 MED ORDER — OXYTOCIN 10 UNIT/ML IJ SOLN
INTRAMUSCULAR | Status: AC
  Filled 2015-02-18: qty 4

## 2015-02-18 MED ORDER — NALOXONE HCL 0.4 MG/ML IJ SOLN: 0.4000 mg | INTRAMUSCULAR | Status: DC | PRN

## 2015-02-18 MED ORDER — MENTHOL 3 MG MT LOZG: 1.0000 | LOZENGE | OROMUCOSAL | Status: DC | PRN

## 2015-02-18 MED ORDER — FENTANYL CITRATE (PF) 100 MCG/2ML IJ SOLN: 25.0000 ug | INTRAMUSCULAR | Status: DC | PRN

## 2015-02-18 MED ORDER — LACTATED RINGERS IV SOLN
INTRAVENOUS | Status: DC
Start: 2015-02-18 — End: 2015-02-21
  Administered 2015-02-18 – 2015-02-19 (×2): via INTRAVENOUS

## 2015-02-18 MED ORDER — SCOPOLAMINE 1 MG/3DAYS TD PT72
MEDICATED_PATCH | TRANSDERMAL | Status: AC
  Administered 2015-02-18: 1.5 mg via TRANSDERMAL
  Filled 2015-02-18: qty 1

## 2015-02-18 MED ORDER — ACETAMINOPHEN 325 MG PO TABS: 650.0000 mg | ORAL_TABLET | ORAL | Status: DC | PRN

## 2015-02-18 MED ORDER — PHENYLEPHRINE 8 MG IN D5W 100 ML (0.08MG/ML) PREMIX OPTIME
INJECTION | INTRAVENOUS | Status: DC | PRN
  Administered 2015-02-18: 60 ug/min via INTRAVENOUS

## 2015-02-18 MED ORDER — MEPERIDINE HCL 25 MG/ML IJ SOLN: 6.2500 mg | INTRAMUSCULAR | Status: DC | PRN

## 2015-02-18 MED ORDER — LACTATED RINGERS IV SOLN
INTRAVENOUS | Status: DC
  Administered 2015-02-18: 10:00:00 via INTRAVENOUS
  Administered 2015-02-18: 125 mL/h via INTRAVENOUS
  Administered 2015-02-18: 09:00:00 via INTRAVENOUS

## 2015-02-18 MED ORDER — NALBUPHINE HCL 10 MG/ML IJ SOLN
5.0000 mg | Freq: Once | INTRAMUSCULAR | Status: DC | PRN
  Filled 2015-02-18: qty 0.5

## 2015-02-18 MED ORDER — DIPHENHYDRAMINE HCL 25 MG PO CAPS: 25.0000 mg | ORAL_CAPSULE | Freq: Four times a day (QID) | ORAL | Status: DC | PRN

## 2015-02-18 MED ORDER — BUPIVACAINE IN DEXTROSE 0.75-8.25 % IT SOLN
INTRATHECAL | Status: DC | PRN
  Administered 2015-02-18: 10.5 mg via INTRATHECAL

## 2015-02-18 MED ORDER — SIMETHICONE 80 MG PO CHEW
80.0000 mg | CHEWABLE_TABLET | ORAL | Status: DC | PRN
  Administered 2015-02-18 – 2015-02-20 (×2): 80 mg via ORAL
  Filled 2015-02-18 (×2): qty 1

## 2015-02-18 MED ORDER — DIPHENHYDRAMINE HCL 25 MG PO CAPS: 25.0000 mg | ORAL_CAPSULE | ORAL | Status: DC | PRN

## 2015-02-18 MED ORDER — MEASLES, MUMPS & RUBELLA VAC ~~LOC~~ INJ
0.5000 mL | INJECTION | Freq: Once | SUBCUTANEOUS | Status: DC
  Filled 2015-02-18: qty 0.5

## 2015-02-18 MED ORDER — OXYTOCIN 40 UNITS IN LACTATED RINGERS INFUSION - SIMPLE MED: 62.5000 mL/h | INTRAVENOUS | Status: AC

## 2015-02-18 MED ORDER — SENNOSIDES-DOCUSATE SODIUM 8.6-50 MG PO TABS
2.0000 | ORAL_TABLET | ORAL | Status: DC
  Administered 2015-02-18 – 2015-02-21 (×3): 2 via ORAL
  Filled 2015-02-18 (×3): qty 2

## 2015-02-18 MED ORDER — LANOLIN HYDROUS EX OINT: 1.0000 "application " | TOPICAL_OINTMENT | CUTANEOUS | Status: DC | PRN

## 2015-02-18 MED ORDER — KETOROLAC TROMETHAMINE 30 MG/ML IJ SOLN
INTRAMUSCULAR | Status: AC
  Administered 2015-02-18: 30 mg via INTRAMUSCULAR
  Filled 2015-02-18: qty 1

## 2015-02-18 MED ORDER — MORPHINE SULFATE (PF) 0.5 MG/ML IJ SOLN
INTRAMUSCULAR | Status: DC | PRN
Start: 2015-02-18 — End: 2015-02-18
  Administered 2015-02-18: .2 mg via INTRATHECAL

## 2015-02-18 MED ORDER — PHENYLEPHRINE 8 MG IN D5W 100 ML (0.08MG/ML) PREMIX OPTIME
INJECTION | INTRAVENOUS | Status: AC
  Filled 2015-02-18: qty 100

## 2015-02-18 MED ORDER — OXYTOCIN 10 UNIT/ML IJ SOLN
40.0000 [IU] | INTRAVENOUS | Status: DC | PRN
  Administered 2015-02-18: 40 [IU] via INTRAVENOUS

## 2015-02-18 MED ORDER — METOCLOPRAMIDE HCL 5 MG/ML IJ SOLN: 10.0000 mg | Freq: Once | INTRAMUSCULAR | Status: DC | PRN

## 2015-02-18 MED ORDER — TETANUS-DIPHTH-ACELL PERTUSSIS 5-2.5-18.5 LF-MCG/0.5 IM SUSP: 0.5000 mL | Freq: Once | INTRAMUSCULAR | Status: DC

## 2015-02-18 MED ORDER — KETOROLAC TROMETHAMINE 30 MG/ML IJ SOLN
30.0000 mg | Freq: Once | INTRAMUSCULAR | Status: AC
  Administered 2015-02-18: 30 mg via INTRAMUSCULAR

## 2015-02-18 MED ORDER — DIPHENHYDRAMINE HCL 50 MG/ML IJ SOLN: 12.5000 mg | INTRAMUSCULAR | Status: DC | PRN

## 2015-02-18 SURGICAL SUPPLY — 32 items
BENZOIN TINCTURE PRP APPL 2/3 (GAUZE/BANDAGES/DRESSINGS) ×2 IMPLANT
CLAMP CORD UMBIL (MISCELLANEOUS) IMPLANT
CLOTH BEACON ORANGE TIMEOUT ST (SAFETY) ×2 IMPLANT
CONTAINER PREFILL 10% NBF 15ML (MISCELLANEOUS) IMPLANT
DRAPE SHEET LG 3/4 BI-LAMINATE (DRAPES) IMPLANT
DRSG OPSITE POSTOP 4X10 (GAUZE/BANDAGES/DRESSINGS) ×2 IMPLANT
DURAPREP 26ML APPLICATOR (WOUND CARE) ×2 IMPLANT
ELECT REM PT RETURN 9FT ADLT (ELECTROSURGICAL) ×2
ELECTRODE REM PT RTRN 9FT ADLT (ELECTROSURGICAL) ×1 IMPLANT
EXTRACTOR VACUUM KIWI (MISCELLANEOUS) IMPLANT
EXTRACTOR VACUUM M CUP 4 TUBE (SUCTIONS) IMPLANT
GLOVE ORTHO TXT STRL SZ7.5 (GLOVE) ×2 IMPLANT
GOWN STRL REUS W/TWL LRG LVL3 (GOWN DISPOSABLE) ×4 IMPLANT
KIT ABG SYR 3ML LUER SLIP (SYRINGE) IMPLANT
NEEDLE HYPO 25X5/8 SAFETYGLIDE (NEEDLE) ×2 IMPLANT
NS IRRIG 1000ML POUR BTL (IV SOLUTION) ×2 IMPLANT
PACK C SECTION WH (CUSTOM PROCEDURE TRAY) ×2 IMPLANT
PAD OB MATERNITY 4.3X12.25 (PERSONAL CARE ITEMS) ×2 IMPLANT
PENCIL SMOKE EVAC W/HOLSTER (ELECTROSURGICAL) ×2 IMPLANT
RTRCTR C-SECT PINK 25CM LRG (MISCELLANEOUS) ×2 IMPLANT
STRIP CLOSURE SKIN 1/2X4 (GAUZE/BANDAGES/DRESSINGS) ×2 IMPLANT
SUT CHROMIC 1 CTX 36 (SUTURE) ×4 IMPLANT
SUT PLAIN 0 NONE (SUTURE) IMPLANT
SUT PLAIN 2 0 XLH (SUTURE) IMPLANT
SUT VIC AB 0 CT1 27 (SUTURE) ×2
SUT VIC AB 0 CT1 27XBRD ANBCTR (SUTURE) ×2 IMPLANT
SUT VIC AB 2-0 CT1 (SUTURE) ×2 IMPLANT
SUT VIC AB 2-0 CT1 27 (SUTURE) ×1
SUT VIC AB 2-0 CT1 TAPERPNT 27 (SUTURE) ×1 IMPLANT
SUT VIC AB 4-0 KS 27 (SUTURE) IMPLANT
TOWEL OR 17X24 6PK STRL BLUE (TOWEL DISPOSABLE) ×2 IMPLANT
TRAY FOLEY CATH SILVER 14FR (SET/KITS/TRAYS/PACK) ×2 IMPLANT

## 2015-02-18 NOTE — Interval H&P Note (Signed)
History and Physical Interval Note:  02/18/2015 8:36 AM  Leslie Hatfield  has presented today for surgery, with the diagnosis of Repeat C/Section  The various methods of treatment have been discussed with the patient and family. After consideration of risks, benefits and other options for treatment, the patient has consented to  Procedure(s): CESAREAN SECTION (N/A) as a surgical intervention .  The patient's history has been reviewed, patient examined, no change in status, stable for surgery.  I have reviewed the patient's chart and labs.  Questions were answered to the patient's satisfaction.     [name] D

## 2015-02-18 NOTE — Anesthesia Postprocedure Evaluation (Signed)
  Anesthesia Post-op Note  Patient: Leslie Hatfield, Leslie Hatfield  Procedure(s) Performed: Procedure(s): CESAREAN SECTION (N/A)  Patient Location: PACU  Anesthesia Type:Spinal  Level of Consciousness: awake, alert  and oriented  Airway and Oxygen Therapy: Patient Spontanous Breathing  Post-op Pain: none  Post-op Assessment: Post-op Vital signs reviewed, Patient's Cardiovascular Status Stable, Respiratory Function Stable, Patent Airway, No signs of Nausea or vomiting, Pain level controlled, No headache, No backache and Spinal receding LLE Motor Response: Purposeful movement LLE Sensation: Tingling RLE Motor Response: Purposeful movement RLE Sensation: Tingling      Post-op Vital Signs: Reviewed and stable  Last Vitals:  Filed Vitals:   02/18/15 1125  BP:   Pulse: 79  Temp:   Resp: 21    Complications: No apparent anesthesia complications

## 2015-02-18 NOTE — Consult Note (Signed)
[facility]  Delivery Note:  C-section       02/18/2015  9:43 AM  I was called to the operating room at the request of the patient's obstetrician (Dr. Jackelyn Knife) for a repeat c-section.  PRENATAL HX: 29 y/o [id number] at 40 weeks and 3 days who presents for repeat c-section.  Her pregnancy has been uncomplicated  INTRAPARTUM HX:   Repeat c-section with AROM at delivery  DELIVERY:  Infant was vigorous at delivery, requiring no resuscitation other than standard warming, drying and stimulation.  APGARs 8 and 9.  Exam within normal limits.  After 5 minutes, baby left with nurse to assist parents with skin-to-skin care.   _____________________ Electronically Signed By: Maryan Char, MD Neonatologist

## 2015-02-18 NOTE — Transfer of Care (Signed)
Immediate Anesthesia Transfer of Care Note  Patient: Leslie Hatfield  Procedure(s) Performed: Procedure(s): CESAREAN SECTION (N/A)  Patient Location: PACU  Anesthesia Type:Spinal  Level of Consciousness: awake  Airway & Oxygen Therapy: Patient Spontanous Breathing  Post-op Assessment: Report given to RN  Post vital signs: Reviewed and stable  Last Vitals:  Filed Vitals:   02/18/15 0746  BP: 122/84  Temp: 36.8 C  Resp: 20    Complications: No apparent anesthesia complications

## 2015-02-18 NOTE — Lactation Note (Signed)
This note was copied from the chart of Leslie Franchot Gallo. Lactation Consultation Note Initial visit at 11 hours of age.  Mom reports baby had a good feeding after delivery and has been sleepy.  Baby now awake with diaper change.  Mom is able to hand expression with colostrum visible, LC reinforced proper technique for hand expression. Assisted mom with holding baby STS at left breast.  Minimal assist with latch, baby does well with mouth open and lips flanged.  Assisted with cross cradle hold so mom has more control over latch.  Mom receptive to teaching.   [facility] LC resources given and discussed.  Encouraged to feed with early cues on demand.  Early newborn behavior discussed.  Mom to call for assist as needed.    Patient Name: Leslie Hatfield Today's Date: 02/18/2015 Reason for consult: Initial assessment   Maternal Data Has patient been taught Hand Expression?: Yes Does the patient have breastfeeding experience prior to this delivery?: Yes  Feeding Feeding Type: Breast Fed  LATCH Score/Interventions Latch: Repeated attempts needed to sustain latch, nipple held in mouth throughout feeding, stimulation needed to elicit sucking reflex. Intervention(s): Adjust position;Assist with latch;Breast massage;Breast compression  Audible Swallowing: A few with stimulation  Type of Nipple: Everted at rest and after stimulation  Comfort (Breast/Nipple): Soft / non-tender     Hold (Positioning): Assistance needed to correctly position infant at breast and maintain latch. Intervention(s): Breastfeeding basics reviewed;Support Pillows;Position options;Skin to skin  LATCH Score: 7  Lactation Tools Discussed/Used WIC Program: Yes   Consult Status Consult Status: Follow-up Date: 02/19/15 Follow-up type: In-patient    Jannifer Rodney 02/18/2015, 8:54 PM

## 2015-02-18 NOTE — Anesthesia Procedure Notes (Signed)
Spinal Patient location during procedure: OR Start time: 02/18/2015 9:17 AM Staffing Anesthesiologist: Mal Amabile Performed by: anesthesiologist  Preanesthetic Checklist Completed: patient identified, site marked, surgical consent, pre-op evaluation, timeout performed, IV checked, risks and benefits discussed and monitors and equipment checked Spinal Block Patient position: sitting Prep: site prepped and draped and DuraPrep Patient monitoring: heart rate, cardiac monitor, continuous pulse ox and blood pressure Approach: midline Location: L3-4 Injection technique: single-shot Needle Needle type: Sprotte  Needle gauge: 24 G Needle length: 9 cm Needle insertion depth: 6 cm Assessment Sensory level: T4 Additional Notes Patient tolerated procedure well. Adequate sensory level.

## 2015-02-18 NOTE — Op Note (Signed)
Preoperative diagnosis: Intrauterine pregnancy at 39 weeks, previous cesarean section Postoperative diagnosis: Same Procedure: Repeat low transverse cesarean section without extensions Surgeon: Lavina Hamman M.D. Assistant:  Sherian Rein, MD Anesthesia: Spinal  Findings: Patient had normal gravid anatomy and delivered a viable female infant with Apgars of 8 and 9 weight pending, thin LUS Estimated blood loss: 1000 cc Specimens: Placenta sent to labor and delivery Complications: None  Procedure in detail: The patient was taken to the operating room and placed in the sitting position. Dr. Malen Gauze instilled spinal anesthesia.  She was then placed in the dorsosupine position with left tilt. Abdomen was then prepped and draped in the usual sterile fashion, and a foley catheter was inserted. The level of her anesthesia was found to be adequate. Abdomen was entered via a standard Pfannenstiel incision through her previous scar. Once the peritoneal cavity was entered the Alexis disposable self-retaining retractor was placed and good visualization was achieved. A 4 cm transverse incision was then made in the thin lower uterine segment pushing the bladder inferior. Once the uterine cavity was entered the incision was extended digitally. The fetal vertex was grasped and delivered through the incision atraumatically. Mouth and nares were suctioned. The remainder of the infant then delivered atraumatically. Cord was doubly clamped and cut and the infant handed to the awaiting pediatric team. Cord blood was obtained. The placenta delivered spontaneously. Uterus was wiped dry with clean lap pad and all clots and debris were removed. Uterine incision was inspected and found to be free of extensions. Uterine incision was closed in 2 layers with running #1 Chromic since it was thin.  2 figure 8 sutures of #1 Chromic used for bleeding. Tubes and ovaries were inspected and found to be normal. Uterine incision was  inspected and found to be hemostatic. Bleeding from serosal edges was controlled with electrocautery. The Alexis retractor was removed. Subfascial space was irrigated and made hemostatic with electrocautery. Peritoneum was closed with 2-0 Vicryl.  Fascia was closed in running fashion starting at both ends and meeting in the middle with 0 Vicryl. Subcutaneous tissue was then irrigated and made hemostatic with electrocautery, then closed with running 2-0 plain gut. Skin was closed with running 4-0 Vicryl subcuticular suture followed by steri-strips and a sterile dressing. Patient tolerated the procedure well and was taken to the recovery in stable condition. Counts were correct x2, she received Ancef 2 g IV at the beginning of the procedure and she had PAS hose on throughout the procedure.

## 2015-02-18 NOTE — H&P (Signed)
Leslie Hatfield is a 29 y.o. female, G5 P1021, EGA [redacted] weeks with EDC 10-8 presenting for repeat c-section.  She was planning to try VBAC, but her cervix is closed, her pelvis is on the narrow side, so she recently decided to proceed with repeat c-section.  Leslie Hatfield otherwise uncomplicated.  Maternal Medical History:  Fetal activity: Perceived fetal activity is normal.    Prenatal complications: no prenatal complications   OB History    Gravida Para Term Preterm AB TAB SAB Ectopic Multiple Living   Past Medical History  Diagnosis Date  . No pertinent past medical history   . Anemia    Past Surgical History  Procedure Laterality Date  . Vaginal polyps removed    . Vaginal poloyps    . Cesarean section  12/04/2010    Procedure: CESAREAN SECTION;  Surgeon: Oliver Pila;  Location: [facility];  Service: Gynecology;  Laterality: N/A;  . Cesarean section     Family History: family history includes Diabetes in her paternal grandfather and paternal grandmother; Heart disease in her maternal grandfather and maternal grandmother. Social History:  reports that she has never smoked. She does not have any smokeless tobacco history on file. She reports that she does not drink alcohol or use illicit drugs.   Prenatal Transfer Tool  Maternal Diabetes: No Genetic Screening: Declined Maternal Ultrasounds/Referrals: Normal Fetal Ultrasounds or other Referrals:  None Maternal Substance Abuse:  No Significant Maternal Medications:  None Significant Maternal Lab Results:  Lab values include: Group B Strep positive Other Comments:  repeat c-section  Review of Systems  Respiratory: Negative.   Cardiovascular: Negative.       Blood pressure 122/84, temperature 98.2 F (36.8 C), temperature source Oral, resp. rate 20, last menstrual period 05/11/2014, unknown if currently breastfeeding. Maternal Exam:  Abdomen: Patient reports no abdominal tenderness. Surgical scars:  low transverse.   Estimated fetal weight is 8 lbs.   Fetal presentation: vertex  Introitus: Normal vulva. Normal vagina.  Pelvis: questionable for delivery.   Cervix: Cervix evaluated by digital exam.     Physical Exam  Vitals reviewed. Constitutional: She appears well-developed and well-nourished.  Neck: Neck supple. No thyromegaly present.  Cardiovascular: Normal rate, regular rhythm and normal heart sounds.   No murmur heard. Respiratory: Effort normal and breath sounds normal. No respiratory distress. She has no wheezes.  GI: Soft.    Prenatal labs: ABO, Rh: --/--/B POS, B POS (09/29 1305) Antibody: NEG (09/29 1305) Rubella: Immune (03/23 0000) RPR: Non Reactive (09/29 1305)  HBsAg: Negative (03/23 0000)  HIV: Non-reactive, Non-reactive (03/23 0000)  GBS:   pos  Assessment/Plan: IUP at 39 weeks, previous c-section, for repeat c-section.  Surgical procedure and risks have been discussed.     Jamelle Noy D 02/18/2015, 8:00 AM

## 2015-02-19 LAB — CBC
HEMATOCRIT: 26 % — AB (ref 36.0–46.0)
HEMOGLOBIN: 8.6 g/dL — AB (ref 12.0–15.0)
MCH: 29.9 pg (ref 26.0–34.0)
MCHC: 33.1 g/dL (ref 30.0–36.0)
MCV: 90.3 fL (ref 78.0–100.0)
Platelets: 146 10*3/uL — ABNORMAL LOW (ref 150–400)
RBC: 2.88 MIL/uL — AB (ref 3.87–5.11)
RDW: 15.3 % (ref 11.5–15.5)
WBC: 6.9 10*3/uL (ref 4.0–10.5)

## 2015-02-19 NOTE — Progress Notes (Signed)
Subjective: Postpartum Day 1: Cesarean Delivery Patient reports incisional pain and tolerating PO.  Nl lochia, pain controlled  Objective: Vital signs in last 24 hours: Temp:  [97.8 F (36.6 C)-98.6 F (37 C)] 98.6 F (37 C) (10/02 0220) Pulse Rate:  [70-83] 83 (10/02 0220) Resp:  [13-24] 20 (10/02 0220) BP: (104-124)/(55-84) 114/62 mmHg (10/02 0220) SpO2:  [93 %-98 %] 94 % (10/02 0220)  Physical Exam:  General: alert and no distress Lochia: appropriate Uterine Fundus: firm Incision: healing well DVT Evaluation: No evidence of DVT seen on physical exam.   Recent Labs  02/16/15 1305  HGB 10.1*  HCT 30.6*    Assessment/Plan: Status post Cesarean section. Doing well postoperatively.  Continue current care.  Bovard-Stuckert, Donzel Romack 02/19/2015, 5:17 AM

## 2015-02-19 NOTE — Lactation Note (Signed)
This note was copied from the chart of Leslie Hatfield. Lactation Consultation Note  Patient Name: Leslie Hatfield [id number] Date: 02/19/2015 Reason for consult: Follow-up assessment   With this experience breastfeeding mom and LGA baby.The baby is now 68 hours old. Mom was trying to latch, and having trouble. Mom has large, soft, breasts with small nipples. I showed mom how to position the baby, and compress her tissue, and the baby latched well. With strong, rhythmic suckles. Mom knows to call for lactation elp for questions/concerns     Maternal Data    Feeding Feeding Type: Breast Fed Length of feed: 10 min  LATCH Score/Interventions Latch: Repeated attempts needed to sustain latch, nipple held in mouth throughout feeding, stimulation needed to elicit sucking reflex. Intervention(s): Adjust position;Assist with latch;Breast compression  Audible Swallowing: Spontaneous and intermittent (lots of eassily exp colostrum) Intervention(s): Hand expression  Type of Nipple: Everted at rest and after stimulation  Comfort (Breast/Nipple): Soft / non-tender     Hold (Positioning): Assistance needed to correctly position infant at breast and maintain latch. Intervention(s): Breastfeeding basics reviewed;Support Pillows;Position options;Skin to skin  LATCH Score: 8  Lactation Tools Discussed/Used     Consult Status Consult Status: Follow-up Date: 02/20/15 Follow-up type: In-patient    Alfred Levins 02/19/2015, 3:26 PM

## 2015-02-19 NOTE — Addendum Note (Signed)
Addendum  created 02/19/15 1610 by Collier Flowers, CRNA   Modules edited: Notes Section   Notes Section:  File: [id number]

## 2015-02-19 NOTE — Anesthesia Postprocedure Evaluation (Signed)
  Anesthesia Post-op Note  Patient: Copywriter, advertising  Procedure(s) Performed: Procedure(s): CESAREAN SECTION (N/A)  Patient Location: Mother/Baby  Anesthesia Type:Spinal  Level of Consciousness: awake, alert , oriented and patient cooperative  Airway and Oxygen Therapy: Patient Spontanous Breathing  Post-op Pain: mild  Post-op Assessment: Post-op Vital signs reviewed, Patient's Cardiovascular Status Stable, Respiratory Function Stable, Patent Airway, No signs of Nausea or vomiting, Adequate PO intake, Pain level controlled, No headache, No backache and Patient able to bend at knees LLE Motor Response: Purposeful movement LLE Sensation: Tingling RLE Motor Response: Purposeful movement RLE Sensation: Tingling      Post-op Vital Signs: Reviewed and stable  Last Vitals:  Filed Vitals:   02/19/15 0220  BP: 114/62  Pulse: 83  Temp: 37 C  Resp: 20    Complications: No apparent anesthesia complications

## 2015-02-20 ENCOUNTER — Encounter (HOSPITAL_COMMUNITY): Payer: Self-pay | Admitting: Obstetrics and Gynecology

## 2015-02-20 NOTE — Lactation Note (Signed)
This note was copied from the chart of Leslie Hatfield. Lactation Consultation Note  Patient Name: Leslie Hatfield [name] Date: 02/20/2015 Reason for consult: Follow-up assessment Baby 53 hours old. Mom has visitor in room and baby sleeping in crib. Mom states that baby is has been nursing well and she is seeing baby swallow at breast. Mom denies any nipple/breast pain. Mom states that baby cues to nurse with his hands at his mouth. Enc mom to continue nursing with cues, and offering STS often.   Maternal Data    Feeding Feeding Type: Breast Fed Length of feed: 30 min  LATCH Score/Interventions                      Lactation Tools Discussed/Used     Consult Status Consult Status: Follow-up Date: 02/21/15 Follow-up type: In-patient    Geralynn Ochs 02/20/2015, 3:08 PM

## 2015-02-20 NOTE — Progress Notes (Signed)
POD #2 Doing ok, sore Afeb, VSS Abd- soft, fundus firm incision intact Continue routine care, ambulate in halls

## 2015-02-21 MED ORDER — OXYCODONE-ACETAMINOPHEN 5-325 MG PO TABS: 1.0000 | ORAL_TABLET | ORAL | Status: DC | PRN

## 2015-02-21 MED ORDER — IBUPROFEN 600 MG PO TABS: 600.0000 mg | ORAL_TABLET | Freq: Four times a day (QID) | ORAL | Status: DC

## 2015-02-21 NOTE — Discharge Summary (Signed)
Obstetric Discharge Summary Reason for Admission: cesarean section Prenatal Procedures: none Intrapartum Procedures: cesarean: low cervical, transverse Postpartum Procedures: none Complications-Operative and Postpartum: none HEMOGLOBIN  Date Value Ref Range Status  02/19/2015 8.6* 12.0 - 15.0 g/dL Final   HCT  Date Value Ref Range Status  02/19/2015 26.0* 36.0 - 46.0 % Final    Physical Exam:  General: alert Lochia: appropriate Uterine Fundus: firm Incision: healing well   Discharge Diagnoses: Term Pregnancy-delivered  Discharge Information: Date: 02/21/2015 Activity: pelvic rest and no strenuous activity Diet: routine Medications: Ibuprofen and Percocet Condition: stable Instructions: refer to practice specific booklet Discharge to: home Follow-up Information    Follow up with Jaecob Lowden D, MD. Schedule an appointment as soon as possible for a visit in 2 weeks.   Specialty:  Obstetrics and Gynecology   Contact information:   [address], [address] [place] [address] [phone]       Newborn Data: Live born female  Birth Weight: 9 lb 6.6 oz (4270 g) APGAR: 8, 9  Home with mother.  Elbie Statzer D 02/21/2015, 8:29 AM

## 2015-02-21 NOTE — Discharge Instructions (Signed)
As per discharge pamphlet °

## 2015-02-21 NOTE — Progress Notes (Signed)
POD #3 Doing well, pain improved Afeb, VSS Abd- benign D/c home

## 2015-02-21 NOTE — Lactation Note (Signed)
This note was copied from the chart of Leslie Hatfield. Lactation Consultation Note  Follow up consult with Leslie Hatfield, mom and dad. Infant with 14 BF for 10-20 minutes, 6 voids and 6 stools in last 24 hours. Infant weight loss 9%. Infant with latch scores of 8-10. Mom reports she has no soreness to nipples and has not noticed any changes to breast today. Mom with large breasts and small nipples. Infant was awake and cueing to feed. Mom able to latch him independently. Infant with vigorous sucks and several swallows and gulps heard. Mom is massaging and compressing breast with feeding. Mom reports infant often wants to sleep at breast. Enc. Mom to awaken as necessary and to elongate feeding times to at least 20 minutes. Infant fed on right breast in cross cradle hold and then drifted off to sleep, awakened and placed to left breast in football hold and was vigorously nursing and swallowing again. Fed for 20 minutes for this feeding. Reviewed Supply and demand, awakening techniques, NL NB feeding behaviors, I/O, engorgement prevention. Mom has a hand pump and single electric pump at home, she plans to stay at home with this baby and hopes to BF him longer than 6 weeks. Reviewed BF information in Taking Care of Baby and Me Booklet and LC Brochure. Parents are aware of LC phone #, OP services and Support Groups. Enc. Parents to call with questions/concerns.  Patient Name: Leslie Hatfield [name] Date: 02/21/2015 Reason for consult: Follow-up assessment   Maternal Data Does the patient have breastfeeding experience prior to this delivery?: Yes  Feeding Feeding Type: Breast Fed Length of feed: 20 min  LATCH Score/Interventions Latch: Grasps breast easily, tongue down, lips flanged, rhythmical sucking. Intervention(s): Breast massage;Breast compression  Audible Swallowing: Spontaneous and intermittent Intervention(s): Hand expression;Alternate breast massage  Type of Nipple: Everted at rest and  after stimulation  Comfort (Breast/Nipple): Soft / non-tender     Hold (Positioning): No assistance needed to correctly position infant at breast. Intervention(s): Breastfeeding basics reviewed;Support Pillows;Position options;Skin to skin  LATCH Score: 10  Lactation Tools Discussed/Used     Consult Status Consult Status: Complete Follow-up type: Call as needed    Ed Blalock 02/21/2015, 8:27 AM

## 2015-07-10 ENCOUNTER — Encounter (HOSPITAL_COMMUNITY): Payer: Self-pay

## 2015-07-10 ENCOUNTER — Emergency Department (HOSPITAL_COMMUNITY)
Admission: EM | Admit: 2015-07-10 | Discharge: 2015-07-10 | Disposition: A | Discharge status: Home / Self Care | Payer: Medicaid Other | Attending: Emergency Medicine | Admitting: Emergency Medicine

## 2015-07-10 DIAGNOSIS — M545 Low back pain: Secondary | ICD-10-CM | POA: Diagnosis not present

## 2015-07-10 DIAGNOSIS — Z791 Long term (current) use of non-steroidal anti-inflammatories (NSAID): Secondary | ICD-10-CM | POA: Insufficient documentation

## 2015-07-10 DIAGNOSIS — G8929 Other chronic pain: Secondary | ICD-10-CM | POA: Insufficient documentation

## 2015-07-10 DIAGNOSIS — M722 Plantar fascial fibromatosis: Secondary | ICD-10-CM | POA: Diagnosis not present

## 2015-07-10 DIAGNOSIS — Z862 Personal history of diseases of the blood and blood-forming organs and certain disorders involving the immune mechanism: Secondary | ICD-10-CM | POA: Insufficient documentation

## 2015-07-10 DIAGNOSIS — Z79899 Other long term (current) drug therapy: Secondary | ICD-10-CM | POA: Diagnosis not present

## 2015-07-10 DIAGNOSIS — M79671 Pain in right foot: Secondary | ICD-10-CM | POA: Diagnosis present

## 2015-07-10 MED ORDER — IBUPROFEN 600 MG PO TABS: 600.0000 mg | ORAL_TABLET | Freq: Four times a day (QID) | ORAL | Status: DC | PRN

## 2015-07-10 MED ORDER — IBUPROFEN 800 MG PO TABS: 800.0000 mg | ORAL_TABLET | Freq: Three times a day (TID) | ORAL | Status: DC | PRN

## 2015-07-10 NOTE — ED Notes (Signed)
Patient here with increasing pain to right heel, reports has been going on since pregnancy, denies trauma

## 2015-07-10 NOTE — ED Provider Notes (Signed)
CSN: [id number]     Arrival date & time 07/10/15  1108 History  By signing my name below, I, Leslie Hatfield, attest that this documentation has been prepared under the direction and in the presence of non-physician practitioner, Trixie Dredge, PA-C. Electronically Signed: [name], Scribe. 07/10/2015. 1:12 PM.    Chief Complaint  Patient presents with  . heel pain     The history is provided by the patient. No language interpreter was used.     HPI Comments: Leslie Hatfield is a 30 y.o. female who presents to the Emergency Department complaining of sudden onset, constant, right heel pain and soreness beginning several months ago, gradually worsening. Pt states that her heel pain has been ongoing since pregnancy but, until recently, the pain would disappear after walking; she states that recently the pain has been constant and nothing alleviates it. 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 Pt denies any known injury. 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  She has not tried any medications for her pain; she reports that her husband will massage her right heel with no relief. She has not changed shoes recently; she wears her regular tennis shoes most of the time. She endorses pain exacerbation with deep palpation to right heel. Pt has two young children and is still nursing her 67 month old child. Denies any injury.  She denies numbness, leg swelling, or any other associated symptoms at this time.    Past Medical History  Diagnosis Date  . No pertinent past medical history   . Anemia    Past Surgical History  Procedure Laterality Date  . Vaginal polyps removed    . Vaginal poloyps    . Cesarean section  12/04/2010    Procedure: CESAREAN SECTION;  Surgeon: Oliver Pila;  Location: [facility];  Service: Gynecology;  Laterality: N/A;  . Cesarean section    . Cesarean  section N/A 02/18/2015    Procedure: CESAREAN SECTION;  Surgeon: Lavina Hamman, MD;  Location: [facility];  Service: Obstetrics;  Laterality: N/A;   Family History  Problem Relation Age of Onset  . Heart disease Maternal Grandmother   . Heart disease Maternal Grandfather   . Diabetes Paternal Grandfather   . Diabetes Paternal Grandmother    Social History  Substance Use Topics  . Smoking status: Never Smoker   . Smokeless tobacco: None  . Alcohol Use: No   OB History    Gravida Para Term Preterm AB TAB SAB Ectopic Multiple Living   0 1     Review of Systems  Constitutional: Negative for fever.  Cardiovascular: Negative for leg swelling.  Musculoskeletal: Positive for back pain (lower) and arthralgias (right heel, right leg). Negative for joint swelling.  Skin: Negative for color change.  Allergic/Immunologic: Negative for immunocompromised state.  Neurological: Negative for weakness and numbness.  Hematological: Does not bruise/bleed easily.  Psychiatric/Behavioral: Negative for self-injury.      Allergies  Review of patient's allergies indicates no known allergies.  Home Medications   Prior to Admission medications   Medication Sig Start Date End Date Taking? Authorizing Provider  acetaminophen (TYLENOL) 500 MG tablet Take 1,000 mg by mouth every 6 (six) hours as needed for moderate pain or headache.    Historical Provider, MD  ibuprofen (ADVIL,MOTRIN) 600 MG tablet  Take 1 tablet (600 mg total) by mouth every 6 (six) hours. 02/21/15   Lavina Hamman, MD  Iron TABS Take 1 tablet by mouth daily.    Historical Provider, MD  oxyCODONE-acetaminophen (PERCOCET/ROXICET) 5-325 MG tablet Take 1-2 tablets by mouth every 4 (four) hours as needed for severe pain. 02/21/15   Lavina Hamman, MD  Prenatal Vit-Fe Fumarate-FA (PRENATAL MULTIVITAMIN) TABS tablet Take 1 tablet by mouth daily at 12 noon.    Historical Provider, MD  promethazine (PHENERGAN) 25 MG tablet Take 1 tablet  (25 mg total) by mouth every 6 (six) hours as needed for nausea or vomiting. Patient not taking: Reported on 02/14/2015 07/02/14   Aviva Signs, CNM   BP 140/97 mmHg  Pulse 96  Temp(Src) 98.1 F (36.7 C) (Oral)  Resp 16  Ht  (1.6 m)  Wt 220 lb (99.791 kg)  BMI 38.98 kg/m2  SpO2 97% Physical Exam  Constitutional: She appears well-developed and well-nourished. No distress.  HENT:  Head: Normocephalic and atraumatic.  Neck: Neck supple.  Pulmonary/Chest: Effort normal.  Musculoskeletal:  Right lower extremity: right calf without edema or tenderness Distal pulses intact Moves all joints Sensation intact Focal tenderness at the insertion of the plantar fascia, plantar aspect of right foot.  No skin changes.    Neurological: She is alert.  Skin: She is not diaphoretic.  Nursing note and vitals reviewed.   ED Course  Procedures (including critical care time)  DIAGNOSTIC STUDIES: Oxygen Saturation is 97% on RA, normal by my interpretation.    COORDINATION OF CARE: 1:12 PM Discussed treatment plan with pt at bedside and pt agreed to plan.   Labs Review Labs Reviewed - No data to display  Imaging Review No results found. I have personally reviewed and evaluated these images and lab results as part of my medical decision-making.   EKG Interpretation None      MDM   Final diagnoses:  Plantar fasciitis of right foot    Afebrile, nontoxic patient with chronic right heel pain, worse after rest, tender at insertion of plantar fascia.  No injury.  Neurovascularly intact.  No edema, erythema, or warmth.  Doubt cellulitis or DVT.   D/C home with ibuprofen, podiatry follow up, conservative treatment recommendations for plantar fasciitis.  Discussed result, findings, treatment, and follow up  with patient.  Pt given return precautions.  Pt verbalizes understanding and agrees with plan.        I personally performed the services described in this documentation, which was  scribed in my presence. The recorded information has been reviewed and is accurate.      Trixie Dredge, PA-C 07/10/15 1502  Margarita Grizzle, MD 07/10/15 Mikle Bosworth

## 2015-07-10 NOTE — Discharge Instructions (Signed)
Read the information below.  Use the prescribed medication as directed.  Please discuss all new medications with your pharmacist.  You may return to the Emergency Department at any time for worsening condition or any new symptoms that concern you.   If you develop uncontrolled pain, weakness or numbness of the extremity, severe discoloration of the skin, or you are unable to walk, return to the ER for a recheck.      Plantar Fasciitis Plantar fasciitis is a painful foot condition that affects the heel. It occurs when the band of tissue that connects the toes to the heel bone (plantar fascia) becomes irritated. This can happen after exercising too much or doing other repetitive activities (overuse injury). The pain from plantar fasciitis can range from mild irritation to severe pain that makes it difficult for you to walk or move. The pain is usually worse in the morning or after you have been sitting or lying down for a while. CAUSES This condition may be caused by:  Standing for long periods of time.  Wearing shoes that do not fit.  Doing high-impact activities, including running, aerobics, and ballet.  Being overweight.  Having an abnormal way of walking (gait).  Having tight calf muscles.  Having high arches in your feet.  Starting a new athletic activity. SYMPTOMS The main symptom of this condition is heel pain. Other symptoms include:  Pain that gets worse after activity or exercise.  Pain that is worse in the morning or after resting.  Pain that goes away after you walk for a few minutes. DIAGNOSIS This condition may be diagnosed based on your signs and symptoms. Your health care provider will also do a physical exam to check for:  A tender area on the bottom of your foot.  A high arch in your foot.  Pain when you move your foot.  Difficulty moving your foot. You may also need to have imaging studies to confirm the diagnosis. These can  include:  X-rays.  Ultrasound.  MRI. TREATMENT  Treatment for plantar fasciitis depends on the severity of the condition. Your treatment may include:  Rest, ice, and over-the-counter pain medicines to manage your pain.  Exercises to stretch your calves and your plantar fascia.  A splint that holds your foot in a stretched, upward position while you sleep (night splint).  Physical therapy to relieve symptoms and prevent problems in the future.  Cortisone injections to relieve severe pain.  Extracorporeal shock wave therapy (ESWT) to stimulate damaged plantar fascia with electrical impulses. It is often used as a last resort before surgery.  Surgery, if other treatments have not worked after 12 months. HOME CARE INSTRUCTIONS  Take medicines only as directed by your health care provider.  Avoid activities that cause pain.  Roll the bottom of your foot over a bag of ice or a bottle of cold water. Do this for 20 minutes, 3-4 times a day.  Perform simple stretches as directed by your health care provider.  Try wearing athletic shoes with air-sole or gel-sole cushions or soft shoe inserts.  Wear a night splint while sleeping, if directed by your health care provider.  Keep all follow-up appointments with your health care provider. PREVENTION   Do not perform exercises or activities that cause heel pain.  Consider finding low-impact activities if you continue to have problems.  Lose weight if you need to. The best way to prevent plantar fasciitis is to avoid the activities that aggravate your plantar fascia. SEEK  MEDICAL CARE IF:  Your symptoms do not go away after treatment with home care measures.  Your pain gets worse.  Your pain affects your ability to move or do your daily activities.   This information is not intended to replace advice given to you by your health care provider. Make sure you discuss any questions you have with your health care provider.    Document Released: 01/29/2001 Document Revised: 01/25/2015 Document Reviewed: 03/16/2014 Elsevier Interactive Patient Education 2016 [employer].    Emergency Department Resource Guide 1) Find a Doctor and Pay Out of Pocket Although you won't have to find out who is covered by your insurance plan, it is a good idea to ask around and get recommendations. You will then need to call the office and see if the doctor you have chosen will accept you as a new patient and what types of options they offer for patients who are self-pay. Some doctors offer discounts or will set up payment plans for their patients who do not have insurance, but you will need to ask so you aren't surprised when you get to your appointment.  2) Contact Your Local Health Department Not all health departments have doctors that can see patients for sick visits, but many do, so it is worth a call to see if yours does. If you don't know where your local health department is, you can check in your phone book. The CDC also has a tool to help you locate your state's health department, and many state websites also have listings of all of their local health departments.  3) Find a Walk-in Clinic If your illness is not likely to be very severe or complicated, you may want to try a walk in clinic. These are popping up all over the country in pharmacies, drugstores, and shopping centers. They're usually staffed by nurse practitioners or physician assistants that have been trained to treat common illnesses and complaints. They're usually fairly quick and inexpensive. However, if you have serious medical issues or chronic medical problems, these are probably not your best option.  No Primary Care Doctor: - Call Health Connect at  [phone] - they can help you locate a primary care doctor that  accepts your insurance, provides certain services, etc. - Physician Referral Service- [phone]  Chronic Pain Problems: Organization          Address  Phone   Notes  [facility]  [phone] Patients need to be referred by their primary care doctor.   Medication Assistance: Organization         Address  Phone   Notes  [facility] Medication [facility] [address] [place], [place] [address] [phone] --Must be a resident of [facility] -- Must have NO insurance coverage whatsoever (no Medicaid/ Medicare, etc.) -- The pt. MUST have a primary care doctor that directs their care regularly and follows them in the community   MedAssist  [phone]   [employer]  [phone]    Agencies that provide inexpensive medical care: Organization         Address  Phone   Notes  [facility]  [phone]   [facility]    [phone]   [facility] [address] [place], [place] [address] [phone]   [facility] [address] [place] [place] [phone]   Planned Parenthood    [phone]  [facility]    [phone]   [facility]  [address], [place] Phone:  [phone], Fax:  [phone]) [phone] Hours of Operation:  9 am - 6 pm, M-F.  Also accepts Medicaid/Medicare and self-pay.  [facility]  [address], [place] Phone: [phone], Fax: [phone]) [phone]. Hours of Operation:  8:30 am - 5:30 pm, M-F.  Also accepts Medicaid and self-pay.  [facility] [place] [address], [place] Phone: [phone]   [facility] [address], [place], [place] [phone], Ext. [phone] Mondays & Thursdays: 7-9 AM.  First 15 patients are seen on a first come, first serve basis.    Medicaid-accepting [facility] Providers:  Organization         Address  Phone   Notes  [facility] [address], [place] [phone] Also accepts self-pay patients.  [facility] [address], [place]  [phone]   [facility] [address], [place] [phone]) [phone]   [facility] [address] [place] [address], [place] [phone]) [phone]   Renaye RakersVeita Bland [address], [place]   [phone]) [phone] Only accepts [place] Access [place] patients after they have their name applied to their card.   Self-Pay (no insurance) in [facility] [place]:  Organization         Address  Phone   Notes  Sickle Cell Patients, [facility] [address], [place] [phone]) [phone]   [facility] [address], [place] [phone]) [phone]   [facility] [place]  [address], [place] [phone]) [phone]   [facility]/Dr. Osei-Bonsu  [address] [place] [address], [place] or [phone], [place] [phone] Phone number for both [place] and [place] locations is the same.  [facility] [address], [place] [phone]) [phone]   [facility] [place] [address] [place] [address], [place] or [address] [phone]) [phone]   [facility] [address], [place] [phone](336) [phone], phone; [phone](336) [phone], fax Sees patients 1st and 3rd Saturday of every month.  Must not qualify for public or private insurance (i.e. Medicaid, Medicare, [place] Health Choice, Veterans' Benefits)  Household income should be no more than 200% of the poverty level The clinic cannot treat you if you are pregnant or think you are pregnant  Sexually transmitted diseases are not treated at the clinic.    Dental Care: Organization         Address  Phone  Notes  [facility] [address] [place] [address], [place] [phone] Accepts children up to age 28 who are enrolled in [place] or [place] Health Choice; pregnant women with a Medicaid card; and  children who have applied for Medicaid or [place] Health Choice, but were declined, whose parents can pay a reduced fee at time of service.  [facility] [place]  [address], [place] [phone]) [phone] Accepts children up to age 28 who are enrolled in [place] or [place] Health Choice; pregnant women with a Medicaid card; and children who have applied for Medicaid or [place] Health Choice, but were declined, whose parents can pay a reduced fee at time  of service.  [facility]  [address], [place] [phone] Patients are seen by appointment only. Walk-ins are not accepted. [facility] will see patients 28 years of age and older. Monday - Tuesday (8am-5pm) Most Wednesdays (8:30-5pm) $30 per visit, cash only  [facility]  [address], [facility] [phone] Patients are seen by appointment only. Walk-ins are not accepted. [facility] will see patients 28 years of age and older. One Wednesday Evening (Monthly: Volunteer Based).  $30 per visit, cash only  [employer] [facility] [facility]  [phone] for adults; Children under age 234, call Graduate Pediatric Dentistry at [phone]. Children aged 464-14, please call [phone] to request a pediatric application.  Dental services are provided in all areas of dental care including fillings, crowns and bridges, complete and partial dentures, implants, gum treatment, root canals, and extractions. Preventive care is also provided. Treatment is provided to both adults and children. Patients are selected via a lottery and there is often a waiting list.   [facility] [address], [place]  [phone] [email or web address]   [facility] [address], [place], [place] [phone], Ext. [phone] Second and Fourth Thursday of each month, opens at 6:30 AM; Clinic ends at 9 AM.  Patients are seen on a first-come first-served  basis, and a limited number are seen during each clinic.   [facility]  [address], [place], [place] [phone]) [phone]   Eligibility Requirements You must have lived in [place], [place], or [place] counties for at least the last three months.   You cannot be eligible for state or federal sponsored National Cityhealthcare insurance, including CIGNAVeterans Administration, [place]icaid, or [employer]Medicare.   You generally cannot be eligible for healthcare insurance through your employer.    How to apply: Eligibility screenings are held every Tuesday and Wednesday afternoon from 1:00 pm until 4:00 pm. You do not need an appointment for the interview!  [facility] [address], [place], [place] [phone]   [facility]  [phone]   [facility]  [phone]   [facility]  [phone]    Behavioral Health Resources in the Community: Intensive Outpatient Programs Organization         Address  Phone  Notes  [facility] [address], [place], [place] [phone]   [facility] [address], [place], [place] [phone]   [facility] [address], [place], [place]  [phone]   [facility] [address],  [place], [place] [phone] or [phone]   Substance Abuse Resources Organization         Address  Phone  Notes  [facility]  [phone]   [facility]  [phone]   [facility]  [phone]   [facility]  [phone]   Residential & Outpatient Substance Abuse Program  [phone]   Psychological Services Organization         Address  Phone  Notes  [facility]  [phone]- [phone]   [facility]  [phone]- [phone]   [facility] [address], [place] [phone] or [phone]    Mobile Crisis Teams Organization          Address  Phone  Notes  [facility], Mobile Crisis Care Unit  [phone]   [facility] Psychotherapeutic  Services  [address] [place], [place] [phone]   [facility] [address] [place] [phone]    Self-Help/Support Groups Organization         Address  Phone             Notes  [facility] of [place] - variety of support groups  [phone]- [phone] Call for more information  Narcotics Anonymous (NA), [facility] [address], [employer] [place]  2 meetings at this location   Statistician         Address  Phone  Notes  [facility] [address],    [place]  [phone]   [facility]  [address] [place] [address], [place], [place] [phone]   [facility] [address] [place], [place] [phone] Admissions: 8am-3pm M-F  [facility] [address]    [place], [place] [phone]   [facility] [address] [place], [place], [place] [phone]   [facility] [address]  [place], [place] [phone]   [facility] - Intensive Outpatient [address], [place], [place] [phone]   [facility] ([facility] [address]  [place], [place] [phone] or [phone]   [facility] ([facility]) [address], [place], [place] [phone] Accepts Medicaid  [facility] [address]  [place] [phone] Substance Abuse/Addiction Treatment   [facility] Organization         Address  Phone  Notes  [facility]  [phone]   Angie Fava, PhD [address] Kn[address] [place], [place]   [phone] or [phone]   [facility]al   [address] [place], [place] [phone]   [facility] [address] [place], [place] [phone] Insurance/Medicaid/sponsorship  through [facility] [address]                                    [place], [place] [phone] Therapy/tele-psych/case  [facility] [address], [place] [phone]    Dr. Lolly Mustache  [phone]   [facility]  [employer] [facility] Dept. 1) [address] [place] 2) [address] [place] 3)  [address], [place] [phone]  [phone]   [facility] Child Abuse Hotline [phone] or [phone] (After Hours)

## 2015-09-01 ENCOUNTER — Encounter (HOSPITAL_COMMUNITY): Payer: Self-pay | Admitting: Emergency Medicine

## 2015-09-01 ENCOUNTER — Emergency Department (HOSPITAL_COMMUNITY)
Admission: EM | Admit: 2015-09-01 | Discharge: 2015-09-01 | Disposition: A | Discharge status: Home / Self Care | Payer: Medicaid Other | Attending: Emergency Medicine | Admitting: Emergency Medicine

## 2015-09-01 DIAGNOSIS — M546 Pain in thoracic spine: Secondary | ICD-10-CM | POA: Diagnosis present

## 2015-09-01 DIAGNOSIS — Z862 Personal history of diseases of the blood and blood-forming organs and certain disorders involving the immune mechanism: Secondary | ICD-10-CM | POA: Diagnosis not present

## 2015-09-01 DIAGNOSIS — M62838 Other muscle spasm: Secondary | ICD-10-CM | POA: Diagnosis not present

## 2015-09-01 DIAGNOSIS — M542 Cervicalgia: Secondary | ICD-10-CM | POA: Diagnosis not present

## 2015-09-01 DIAGNOSIS — M549 Dorsalgia, unspecified: Secondary | ICD-10-CM

## 2015-09-01 MED ORDER — CYCLOBENZAPRINE HCL 10 MG PO TABS: 10.0000 mg | ORAL_TABLET | Freq: Two times a day (BID) | ORAL | Status: DC | PRN

## 2015-09-01 MED ORDER — NAPROXEN 500 MG PO TABS: 500.0000 mg | ORAL_TABLET | Freq: Two times a day (BID) | ORAL | Status: DC

## 2015-09-01 NOTE — ED Notes (Signed)
Pt reports 2 week hx of midback pain, posterior, stinging neck pain. 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 Hx of back pain after first delivery 4 years ago.

## 2015-09-01 NOTE — ED Provider Notes (Signed)
CSN: [id number]     Arrival date & time 09/01/15  1033 History  By signing my name below, I, Tanda RockersMargaux Venter, attest that this documentation has been prepared under the direction and in the presence of Melburn HakeNicole Ferne [place]ing[place], PA-C. Electronically Signed: Tanda RockersMargaux Venter, ED Scribe. 09/01/2015. 12:43 PM.   Chief Complaint  Patient presents with  . Back Pain    2 week hx of midback pain  . Neck Pain    2 week hx of stinging neck pain   The history is provided by the patient. No language interpreter was used.     HPI Comments: Leslie Hatfield is a 30 y.o. female who presents to the Emergency Department complaining of gradual onset, constant, tight/sharp, mid back pain and neck pain x 2 weeks. The pain is mildly alleviated with standing up and worsened with bending and twisting. 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 She has been taking Ibuprofen without relief. Pt has not applied ice or heat to the area. Denies fever, chills, headache, neck stiffness, abdominal pain, chest pain, nausea, vomiting, diarrhea, urinary or bowel incontinence, saddle anesthesia, weakness in lower extremities, numbness, tingling, or any other associated symptoms. No hx cancer. No recent spinal manipulation. No IVDA.   Past Medical History  Diagnosis Date  . No pertinent past medical history   . Anemia    Past Surgical History  Procedure Laterality Date  . Vaginal polyps removed    . Vaginal poloyps    . Cesarean section  12/04/2010    Procedure: CESAREAN SECTION;  Surgeon: Oliver PilaKathy W Richardson;  Location: [facility];  Service: Gynecology;  Laterality: N/A;  . Cesarean section    . Cesarean section N/A 02/18/2015    Procedure: CESAREAN SECTION;  Surgeon: Lavina Hammanodd Meisinger, MD;  Location: [facility];  Service: Obstetrics;  Laterality: N/A;   Family History  Problem Relation Age of Onset  . Heart disease Maternal  Grandmother   . Heart disease Maternal Grandfather   . Diabetes Paternal Grandfather   . Diabetes Paternal Grandmother    Social History  Substance Use Topics  . Smoking status: Never Smoker   . Smokeless tobacco: None  . Alcohol Use: No   OB History    Gravida Para Term Preterm AB TAB SAB Ectopic Multiple Living   4 2 2  2  2   0 1     Review of Systems  Constitutional: Negative for fever and chills.  Cardiovascular: Negative for chest pain.  Gastrointestinal: Negative for nausea, vomiting, abdominal pain and diarrhea.       Negative for bowel incontinence  Genitourinary:       Negative for urinary incontinence  Musculoskeletal: Positive for back pain and neck pain. Negative for neck stiffness.  Neurological: Negative for weakness, numbness and headaches.       Negative for saddle anesthesia   Allergies  Review of patient's allergies indicates no known allergies.  Home Medications   Prior to Admission medications   Medication Sig Start Date End Date Taking? Authorizing Provider  acetaminophen (TYLENOL) 500 MG tablet Take 1,000 mg by mouth every 6 (six) hours as needed for moderate pain or headache.   Yes Historical Provider, MD  ibuprofen (ADVIL,MOTRIN) 600 MG tablet Take 1 tablet (600 mg total) by mouth every 6 (six) hours as needed for mild pain or moderate pain. 07/10/15  Yes Trixie DredgeEmily West,  PA-C  IUD'S IU 1 each by Intrauterine route continuous.   Yes Historical Provider, MD  cyclobenzaprine (FLEXERIL) 10 MG tablet Take 1 tablet (10 mg total) by mouth 2 (two) times daily as needed for muscle spasms. 09/01/15   Barrett Henle, PA-C  naproxen (NAPROSYN) 500 MG tablet Take 1 tablet (500 mg total) by mouth 2 (two) times daily. 09/01/15   Barrett Henle, PA-C  oxyCODONE-acetaminophen (PERCOCET/ROXICET) 5-325 MG tablet Take 1-2 tablets by mouth every 4 (four) hours as needed for severe pain. Patient not taking: Reported on 09/01/2015 02/21/15   Lavina Hamman, MD   promethazine (PHENERGAN) 25 MG tablet Take 1 tablet (25 mg total) by mouth every 6 (six) hours as needed for nausea or vomiting. Patient not taking: Reported on 02/14/2015 07/02/14   Aviva Signs, CNM   BP 122/91 mmHg  Pulse 93  Temp(Src) 98 F (36.7 C) (Oral)  Resp 16  Wt 99.791 kg  SpO2 99%  Breastfeeding? Yes   Physical Exam  Constitutional: She is oriented to person, place, and time. She appears well-developed and well-nourished.  HENT:  Head: Normocephalic and atraumatic.  Eyes: Conjunctivae and EOM are normal. Right eye exhibits no discharge. Left eye exhibits no discharge. No scleral icterus.  Neck: Normal range of motion. Neck supple.  Cardiovascular: Normal rate.   Pulmonary/Chest: Effort normal.  Musculoskeletal: She exhibits tenderness.  No midline C, T, or L tenderness. Tenderness to palpation over left mid thoracic paraspinal muscle with palpable spasm noted. Mild tenderness over bilateral upper thoracic paraspinal muscles. Full range of motion of neck and back. Full range of motion of bilateral upper and lower extremities, with 5/5 strength. Sensation intact. 2+ radial and PT pulses. Cap refill <2 seconds. Patient able to stand and ambulate without assistance.   Neurological: She is alert and oriented to person, place, and time. She has normal strength and normal reflexes. No sensory deficit. Gait normal.  Nursing note and vitals reviewed.   ED Course  Procedures (including critical care time)  DIAGNOSTIC STUDIES: Oxygen Saturation is 100% on RA, normal by my interpretation.    COORDINATION OF CARE: 12:23 PM-Discussed treatment plan which includes Rx Naprosyn and Flexeril with pt at bedside and pt agreed to plan.   Labs Review Labs Reviewed - No data to display  Imaging Review No results found.   EKG Interpretation None      MDM   Final diagnoses:  Bilateral back pain, unspecified location  Muscle spasm   Patient with back pain.  No neurological  deficits and normal neuro exam.  Patient is ambulatory.  No loss of bowel or bladder control.  No concern for cauda equina.  No fever, night sweats, weight loss, h/o cancer, IVDA, no recent procedure to back. No urinary symptoms suggestive of UTI.  I suspect patient's symptoms are likely due to muscle strain in muscles bowel syndrome palpated on exam. I do not feel any further workup or imaging is warranted at this time. Supportive care and return precaution discussed. Appears safe for discharge at this time. Follow up as indicated in discharge paperwork. Patient given resources to follow up with PCP.   I personally performed the services described in this documentation, which was scribed in my presence. The recorded information has been reviewed and is accurate.      Satira Sark [place], [place] 09/01/15 1406  Samuel Jester, DO 09/03/15 1428

## 2015-09-01 NOTE — Discharge Instructions (Signed)
Take your medications as prescribed. I recommend eating prior to taking her ibuprofen to prevent gastrointestinal side effects. I also recommend applying heat to her back for 3-4 times daily to help with pain associated with her muscle spasm. Refrain from doing any heavy lifting, repetitive movements or exacerbating movements for the next few days until your pain has improved. Please follow up with a primary care provider from the Resource Guide provided below in 1 week as needed. Please return to the Emergency Department if symptoms worsen or new onset of fever, numbness, tingling, groin numbness, loss of bowel or bladder, weakness.

## 2016-04-23 ENCOUNTER — Emergency Department (HOSPITAL_COMMUNITY)
Admission: EM | Admit: 2016-04-23 | Discharge: 2016-04-23 | Disposition: A | Discharge status: Home / Self Care | Payer: Medicaid Other | Attending: Emergency Medicine | Admitting: Emergency Medicine

## 2016-04-23 DIAGNOSIS — K0889 Other specified disorders of teeth and supporting structures: Secondary | ICD-10-CM

## 2016-04-23 MED ORDER — IBUPROFEN 800 MG PO TABS
800.0000 mg | ORAL_TABLET | Freq: Once | ORAL | Status: AC
  Administered 2016-04-23: 800 mg via ORAL
  Filled 2016-04-23: qty 1

## 2016-04-23 MED ORDER — IBUPROFEN 800 MG PO TABS: 800.0000 mg | ORAL_TABLET | Freq: Three times a day (TID) | ORAL | 0 refills | Status: DC

## 2016-04-23 MED ORDER — PENICILLIN V POTASSIUM 500 MG PO TABS: 500.0000 mg | ORAL_TABLET | Freq: Three times a day (TID) | ORAL | 0 refills | Status: DC

## 2016-04-23 NOTE — ED Triage Notes (Signed)
Pt states that she has had R sided facial pain x 3 days. Thinks it may be from a wisdom tooth trying to come in on her R upper side. Dentist appt is 2 weeks away. Alert and oriented.

## 2016-04-23 NOTE — ED Provider Notes (Signed)
[facility]-EMERGENCY DEPT Provider Note   CSN: [id number] Arrival date & time: 04/23/16  2037     History   Chief Complaint Chief Complaint  Patient presents with  . Facial Pain    HPI Leslie Hatfield is a 30 y.o. female.  HPI   30 year old female presenting with facial pain. Patient report for the past 2-3 days she has had sharp throbbing pain primarily to her right upper tooth. Pain is worsened with chewing and this radiates to her right year. She has tried heat and ice with some improvement. She did attempt to reach out to her dentist but doesn't get an appointment for at least 2 weeks. She felt that her with some tooth is trying to come in. She denies fever, neck pain, trouble swallowing, recent injury, or rash.  Past Medical History:  Diagnosis Date  . Anemia   . No pertinent past medical history     Patient Active Problem List   Diagnosis Date Noted  . S/P cesarean section 02/18/2015    Past Surgical History:  Procedure Laterality Date  . CESAREAN SECTION  12/04/2010   Procedure: CESAREAN SECTION;  Surgeon: Oliver PilaKathy W Richardson;  Location: [facility];  Service: Gynecology;  Laterality: N/A;  . CESAREAN SECTION    . CESAREAN SECTION N/A 02/18/2015   Procedure: CESAREAN SECTION;  Surgeon: Lavina Hammanodd Meisinger, MD;  Location: [facility];  Service: Obstetrics;  Laterality: N/A;  . vaginal poloyps    . vaginal polyps removed      OB History    Gravida Para Term Preterm AB Living   4 2 2   2 1    SAB TAB Ectopic Multiple Live Births   2     0 1       Home Medications    Prior to Admission medications   Medication Sig Start Date End Date Taking? Authorizing Provider  acetaminophen (TYLENOL) 500 MG tablet Take 1,000 mg by mouth every 6 (six) hours as needed for moderate pain or headache.    Historical Provider, MD  cyclobenzaprine (FLEXERIL) 10 MG tablet Take 1 tablet (10 mg total) by mouth 2 (two) times daily as needed for muscle spasms. 09/01/15   Barrett HenleNicole Elizabeth Nadeau, PA-C    ibuprofen (ADVIL,MOTRIN) 600 MG tablet Take 1 tablet (600 mg total) by mouth every 6 (six) hours as needed for mild pain or moderate pain. 07/10/15   Trixie DredgeEmily West, PA-C  IUD'S IU 1 each by Intrauterine route continuous.    Historical Provider, MD  naproxen (NAPROSYN) 500 MG tablet Take 1 tablet (500 mg total) by mouth 2 (two) times daily. 09/01/15   Barrett HenleNicole Elizabeth Nadeau, PA-C  oxyCODONE-acetaminophen (PERCOCET/ROXICET) 5-325 MG tablet Take 1-2 tablets by mouth every 4 (four) hours as needed for severe pain. Patient not taking: Reported on 09/01/2015 02/21/15   Lavina Hammanodd Meisinger, MD  promethazine (PHENERGAN) 25 MG tablet Take 1 tablet (25 mg total) by mouth every 6 (six) hours as needed for nausea or vomiting. Patient not taking: Reported on 02/14/2015 07/02/14   Aviva SignsMarie L Williams, CNM    Family History Family History  Problem Relation Age of Onset  . Heart disease Maternal Grandmother   . Heart disease Maternal Grandfather   . Diabetes Paternal Grandfather   . Diabetes Paternal Grandmother     Social History Social History  Substance Use Topics  . Smoking status: Never Smoker  . Smokeless tobacco: Not on file  . Alcohol use No     Allergies   Patient has no  known allergies.   Review of Systems Review of Systems  Constitutional: Negative for fever.  HENT: Positive for dental problem and ear pain. Negative for ear discharge, sore throat and trouble swallowing.   Neurological: Negative for numbness and headaches.     Physical Exam Updated Vital Signs BP 136/85 (BP Location: Left Arm)   Pulse 96   Temp 98.3 F (36.8 C) (Oral)   Resp 16   SpO2 100%   Physical Exam  Constitutional: She appears well-developed and well-nourished. No distress.  HENT:  Head: Atraumatic.  Mouth: Tenderness to tooth #1 with a small old dental avulsion noted to the inferior aspect. No obvious gingival erythema or abscess noted. No signs of deep tissue infection. No trismus.  No TMJ or mastoiditis.   Eyes: Conjunctivae are normal.  Neck: Normal range of motion. Neck supple.  Lymphadenopathy:    She has no cervical adenopathy.  Neurological: She is alert.  Skin: No rash noted.  Psychiatric: She has a normal mood and affect.  Nursing note and vitals reviewed.    ED Treatments / Results  Labs (all labs ordered are listed, but only abnormal results are displayed) Labs Reviewed - No data to display  EKG  EKG Interpretation None       Radiology No results found.  Procedures Procedures (including critical care time)  Medications Ordered in ED Medications - No data to display   Initial Impression / Assessment and Plan / ED Course  I have reviewed the triage vital signs and the nursing notes.  Pertinent labs & imaging results that were available during my care of the patient were reviewed by me and considered in my medical decision making (see chart for details).  Clinical Course     BP 136/85 (BP Location: Left Arm)   Pulse 96   Temp 98.3 F (36.8 C) (Oral)   Resp 16   SpO2 100%    Final Clinical Impressions(s) / ED Diagnoses   Final diagnoses:  Pain, dental    New Prescriptions New Prescriptions   IBUPROFEN (ADVIL,MOTRIN) 800 MG TABLET    Take 1 tablet (800 mg total) by mouth 3 (three) times daily.   PENICILLIN V POTASSIUM (VEETID) 500 MG TABLET    Take 1 tablet (500 mg total) by mouth 3 (three) times daily.   11:12 PM Pt here with dental pain. No concerning feature to suggest deep tissue infection.  Abx and nsaids given.  Dental referral given.     Fayrene HelperBowie Rosslyn Pasion, PA-C 04/23/16 2313    Geoffery Lyonsouglas Delo, MD 04/24/16 Lyda [id number]

## 2016-05-29 DIAGNOSIS — Z6841 Body Mass Index (BMI) 40.0 and over, adult: Secondary | ICD-10-CM | POA: Diagnosis not present

## 2016-05-29 DIAGNOSIS — Z01419 Encounter for gynecological examination (general) (routine) without abnormal findings: Secondary | ICD-10-CM | POA: Diagnosis not present

## 2016-05-29 DIAGNOSIS — Z130 Encounter for screening for diseases of the blood and blood-forming organs and certain disorders involving the immune mechanism: Secondary | ICD-10-CM | POA: Diagnosis not present

## 2016-05-29 DIAGNOSIS — Z1389 Encounter for screening for other disorder: Secondary | ICD-10-CM | POA: Diagnosis not present

## 2016-05-29 DIAGNOSIS — L2089 Other atopic dermatitis: Secondary | ICD-10-CM | POA: Diagnosis not present

## 2016-05-29 DIAGNOSIS — Z30431 Encounter for routine checking of intrauterine contraceptive device: Secondary | ICD-10-CM | POA: Diagnosis not present

## 2016-06-10 DIAGNOSIS — Z30431 Encounter for routine checking of intrauterine contraceptive device: Secondary | ICD-10-CM | POA: Diagnosis not present

## 2016-07-30 ENCOUNTER — Encounter (HOSPITAL_COMMUNITY): Payer: Self-pay | Admitting: Family Medicine

## 2016-07-30 ENCOUNTER — Ambulatory Visit (HOSPITAL_COMMUNITY)
Admission: EM | Admit: 2016-07-30 | Discharge: 2016-07-30 | Disposition: A | Discharge status: Home / Self Care | Payer: BLUE CROSS/BLUE SHIELD | Attending: Internal Medicine | Admitting: Internal Medicine

## 2016-07-30 ENCOUNTER — Ambulatory Visit (INDEPENDENT_AMBULATORY_CARE_PROVIDER_SITE_OTHER): Payer: BLUE CROSS/BLUE SHIELD

## 2016-07-30 DIAGNOSIS — M25511 Pain in right shoulder: Secondary | ICD-10-CM

## 2016-07-30 DIAGNOSIS — S4991XA Unspecified injury of right shoulder and upper arm, initial encounter: Secondary | ICD-10-CM | POA: Diagnosis not present

## 2016-07-30 MED ORDER — IBUPROFEN 800 MG PO TABS: 800.0000 mg | ORAL_TABLET | Freq: Three times a day (TID) | ORAL | 0 refills | Status: DC

## 2016-07-30 NOTE — ED Triage Notes (Signed)
Pt here for right shoulder pain after a fall today. Unable to flex shoulder above 90 degrees. sts that she slipped on the ice and caught herself with that arm.

## 2016-07-30 NOTE — ED Provider Notes (Signed)
CSN: [id number]     Arrival date & time 07/30/16  1448 History   None    Chief Complaint  Patient presents with  . Arm Pain   (Consider location/radiation/quality/duration/timing/severity/associated sxs/prior Treatment) The history is provided by the patient. No language interpreter was used.  Arm Pain  This is a new problem. The current episode started 3 to 5 hours ago. The problem occurs constantly. The problem has been rapidly worsening. Pertinent negatives include no chest pain. Nothing aggravates the symptoms. Nothing relieves the symptoms. She has tried nothing for the symptoms. The treatment provided no relief.  Pt reports she fell on ice today.  Pt complains of pain with moving her right arm.    Past Medical History:  Diagnosis Date  . Anemia   . No pertinent past medical history    Past Surgical History:  Procedure Laterality Date  . CESAREAN SECTION  12/04/2010   Procedure: CESAREAN SECTION;  Surgeon: Oliver PilaKathy W Richardson;  Location: [facility];  Service: Gynecology;  Laterality: N/A;  . CESAREAN SECTION    . CESAREAN SECTION N/A 02/18/2015   Procedure: CESAREAN SECTION;  Surgeon: Lavina Hammanodd Meisinger, MD;  Location: [facility];  Service: Obstetrics;  Laterality: N/A;  . vaginal poloyps    . vaginal polyps removed     Family History  Problem Relation Age of Onset  . Heart disease Maternal Grandmother   . Heart disease Maternal Grandfather   . Diabetes Paternal Grandfather   . Diabetes Paternal Grandmother    Social History  Substance Use Topics  . Smoking status: Never Smoker  . Smokeless tobacco: Never Used  . Alcohol use No   OB History    Gravida Para Term Preterm AB Living   4 2 2   2 1    SAB TAB Ectopic Multiple Live Births   2     0 1     Review of Systems  Cardiovascular: Negative for chest pain.  All other systems reviewed and are negative.   Allergies  Patient has no known allergies.  Home Medications   Prior to Admission medications   Medication Sig Start  Date End Date Taking? Authorizing Provider  ibuprofen (ADVIL,MOTRIN) 800 MG tablet Take 1 tablet (800 mg total) by mouth 3 (three) times daily. 07/30/16   Elson AreasLeslie K Sofia, PA-C  IUD'S IU 1 each by Intrauterine route continuous.    Historical Provider, MD   Meds Ordered and Administered this Visit  Medications - No data to display  BP 142/87   Pulse 95   Temp 98.4 F (36.9 C)   Resp 18   SpO2 99%  No data found.   Physical Exam  Constitutional: She appears well-developed and well-nourished. No distress.  HENT:  Head: Normocephalic and atraumatic.  Neck: Neck supple.  Cardiovascular: Normal rate.   No murmur heard. Pulmonary/Chest: No respiratory distress.  Abdominal: Soft. There is no tenderness.  Genitourinary: Vaginal discharge: tender anterior right shoulder,  pain with range of motion testing,  decreased abduction   Musculoskeletal: She exhibits tenderness. She exhibits no edema.  Neurological: She is alert.  Skin: Skin is warm and dry.  Psychiatric: She has a normal mood and affect.  Nursing note and vitals reviewed.   Urgent Care Course     Procedures (including critical care time)  Labs Review Labs Reviewed - No data to display  Imaging Review Dg Shoulder Right  Result Date: 07/30/2016 CLINICAL DATA:  31 year old who slipped on ice outside earlier today, falling and injuring  the right shoulder. EXAM: RIGHT SHOULDER - 2+ VIEW COMPARISON:  None. FINDINGS: No evidence of acute fracture or glenohumeral dislocation. Subacromial space well preserved. Acromioclavicular joint intact. Well preserved bone mineral density. No intrinsic osseous abnormality. IMPRESSION: Normal examination. Electronically Signed   By: Hulan Saas M.D.   On: 07/30/2016 15:36     Visual Acuity Review  Right Eye Distance:   Left Eye Distance:   Bilateral Distance:    Right Eye Near:   Left Eye Near:    Bilateral Near:         MDM   1. Acute pain of right shoulder    Pt placed  in a sling Meds ordered this encounter  Medications  . ibuprofen (ADVIL,MOTRIN) 800 MG tablet    Sig: Take 1 tablet (800 mg total) by mouth 3 (three) times daily.    Dispense:  21 tablet    Refill:  0    Order Specific Question:   Supervising Provider    Answer:   Linna Hoff [id number]   Follow up with Orthopaedist in 1 week if pain persist An After Visit Summary was printed and given to the patient.     Lonia Skinner Hagan, PA-C 07/30/16 2019

## 2016-07-30 NOTE — ED Notes (Signed)
Bed: UC07 Expected date: 07/30/16 Expected time: 5:00 PM Means of arrival:  Comments:

## 2016-07-30 NOTE — Discharge Instructions (Signed)
Schedule to see the Orthopaedist for recheck in 1 week.  °

## 2016-08-03 ENCOUNTER — Emergency Department (HOSPITAL_COMMUNITY)
Admission: EM | Admit: 2016-08-03 | Discharge: 2016-08-03 | Disposition: A | Discharge status: Home / Self Care | Payer: BLUE CROSS/BLUE SHIELD | Attending: Emergency Medicine | Admitting: Emergency Medicine

## 2016-08-03 ENCOUNTER — Emergency Department (HOSPITAL_COMMUNITY): Payer: BLUE CROSS/BLUE SHIELD

## 2016-08-03 ENCOUNTER — Encounter (HOSPITAL_COMMUNITY): Payer: Self-pay | Admitting: Emergency Medicine

## 2016-08-03 DIAGNOSIS — W010XXD Fall on same level from slipping, tripping and stumbling without subsequent striking against object, subsequent encounter: Secondary | ICD-10-CM | POA: Diagnosis not present

## 2016-08-03 DIAGNOSIS — M533 Sacrococcygeal disorders, not elsewhere classified: Secondary | ICD-10-CM | POA: Diagnosis not present

## 2016-08-03 DIAGNOSIS — S300XXD Contusion of lower back and pelvis, subsequent encounter: Secondary | ICD-10-CM | POA: Insufficient documentation

## 2016-08-03 DIAGNOSIS — S3992XD Unspecified injury of lower back, subsequent encounter: Secondary | ICD-10-CM | POA: Diagnosis not present

## 2016-08-03 DIAGNOSIS — R102 Pelvic and perineal pain: Secondary | ICD-10-CM | POA: Diagnosis not present

## 2016-08-03 DIAGNOSIS — W19XXXA Unspecified fall, initial encounter: Secondary | ICD-10-CM

## 2016-08-03 LAB — PREGNANCY, URINE: Preg Test, Ur: NEGATIVE

## 2016-08-03 MED ORDER — NAPROXEN 500 MG PO TABS: 500.0000 mg | ORAL_TABLET | Freq: Two times a day (BID) | ORAL | 0 refills | Status: DC

## 2016-08-03 NOTE — ED Provider Notes (Signed)
[facility]ER[facility]ENCY [facility]PT Provider Note   CSN: [id number] Arrival date & time: 08/03/16  1316   By signing my name below, I, Freida Busmaniana Omoyeni, attest that this documentation has been prepared under the direction and in the presence of Mathews RobinsonsJessica Yissel Habermehl, PA-C. Electronically Signed: Freida Busmaniana Omoyeni, Scribe. 08/03/2016. 4:34 PM.  History   Chief Complaint Chief Complaint  Patient presents with  . Tailbone Pain  . Rib Injury    The history is provided by the patient. No language interpreter was used.    HPI Comments:  Leslie Hatfield is a 31 y.o. female who presents to the Emergency Department s/p fall  5 days ago complaining of gradual onset, tailbone region which began 4 days ago. Pt slipped on ice and landed on her bottom. She has been able to ambulate since the incident. Her pain is exacerbated when she sits in certain positions.  Pt also reports very slight rectal discharge/stool incontinence; describes "oozy" substance. She is still having normal controlled BMs.  Denies numbness/tingling in her extremities, no perianal numbness. Pt was seen at Urgent Care on 07/30/2016 following the initial accident where she was evaluated for right arm pain. She was discharged with right shoulder sling and Rx for ibuprofen which has provided little relief. Pt also denies h/o CA or IVDA.   Past Medical History:  Diagnosis Date  . Anemia   . No pertinent past medical history     Patient Active Problem List   Diagnosis Date Noted  . S/P cesarean section 02/18/2015    Past Surgical History:  Procedure Laterality Date  . CESAREAN SECTION  12/04/2010   Procedure: CESAREAN SECTION;  Surgeon: Oliver PilaKathy W Richardson;  Location: [facility];  Service: Gynecology;  Laterality: N/A;  . CESAREAN SECTION    . CESAREAN SECTION N/A 02/18/2015   Procedure: CESAREAN SECTION;  Surgeon: Lavina Hammanodd Meisinger, MD;  Location: [facility];  Service: Obstetrics;  Laterality: N/A;  . vaginal poloyps    . vaginal polyps removed      OB History      Gravida Para Term Preterm AB Living   4 2 2   2 1    SAB TAB Ectopic Multiple Live Births   2     0 1       Home Medications    Prior to Admission medications   Medication Sig Start Date End Date Taking? Authorizing Provider  IUD'S IU 1 each by Intrauterine route continuous.    Historical Provider, MD  naproxen (NAPROSYN) 500 MG tablet Take 1 tablet (500 mg total) by mouth 2 (two) times daily with a meal. 08/03/16   Georgiana ShoreJessica B Jonmichael Beadnell, PA-C    Family History Family History  Problem Relation Age of Onset  . Heart disease Maternal Grandmother   . Heart disease Maternal Grandfather   . Diabetes Paternal Grandfather   . Diabetes Paternal Grandmother     Social History Social History  Substance Use Topics  . Smoking status: Never Smoker  . Smokeless tobacco: Never Used  . Alcohol use No     Allergies   Patient has no known allergies.   Review of Systems Review of Systems  Constitutional: Negative for chills and fever.  Respiratory: Negative for chest tightness, shortness of breath and wheezing.   Cardiovascular: Negative for chest pain and leg swelling.  Gastrointestinal: Negative for abdominal distention, abdominal pain, anal bleeding, blood in stool, constipation, diarrhea, rectal pain and vomiting.  Genitourinary: Negative for difficulty urinating.  Musculoskeletal: Positive for arthralgias and myalgias. Negative for  gait problem, neck pain and neck stiffness.       Coccygeal pain when sitting.  Skin: Negative for color change, pallor, rash and wound.  Neurological: Negative for dizziness, weakness and numbness.    Physical Exam Updated Vital Signs BP 125/79 (BP Location: Right Arm)   Pulse 93   Temp 97.5 F (36.4 C) (Oral)   Resp 16   SpO2 97%   Physical Exam  Constitutional: She is oriented to person, place, and time. She appears well-developed and well-nourished. No distress.  Patient is afebrile, non-toxic appearing, seating comfortably in chair in no  acute distress.  HENT:  Head: Normocephalic and atraumatic.  Eyes: Conjunctivae and EOM are normal.  Neck: Normal range of motion. Neck supple.  Cardiovascular: Normal rate, regular rhythm, normal heart sounds and intact distal pulses.   Pulmonary/Chest: Effort normal. No respiratory distress. She has no wheezes. She has no rales. She exhibits no tenderness.  Abdominal: Soft. She exhibits no distension.  Genitourinary:  Genitourinary Comments: Good rectal tone and no abnormalities noted  Musculoskeletal: Normal range of motion. She exhibits tenderness. She exhibits no edema or deformity.  Sacral midline tenderness; no other midline spinal tenderness  NVI  FROM and strength in BLE   Neurological: She is alert and oriented to person, place, and time. No sensory deficit. She exhibits normal muscle tone. Coordination normal.  Flexion of hips, extension and flexion of legs up to the knees, plantar flexion all 5/5 Normal sensation bilaterally Normal stance and gait  Skin: Skin is warm and dry. Capillary refill takes less than 2 seconds. She is not diaphoretic. No erythema. No pallor.  Psychiatric: She has a normal mood and affect.  Nursing note and vitals reviewed.   ED Treatments / Results  DIAGNOSTIC STUDIES:  Oxygen Saturation is 97% on RA, normal by my interpretation.    COORDINATION OF CARE:  2:33 PM Discussed treatment plan with pt at bedside and pt agreed to plan.  Labs (all labs ordered are listed, but only abnormal results are displayed) Labs Reviewed  PREGNANCY, URINE    EKG  EKG Interpretation None       Radiology Dg Pelvis 1-2 Views  Result Date: 08/03/2016 CLINICAL DATA:  Posterior pelvic and coccygeal pain 4 days after fall. EXAM: PELVIS - 1-2 VIEW COMPARISON:  CT 04/25/2012 FINDINGS: Bones, bony structures, joint spaces and soft tissues are within normal. There is no acute fracture. IUD present over the midline pelvis. IMPRESSION: No acute findings.  Electronically Signed   By: Elberta Fortis M.D.   On: 08/03/2016 16:20    Procedures Procedures (including critical care time)  Medications Ordered in ED Medications - No data to display   Initial Impression / Assessment and Plan / ED Course  I have reviewed the triage vital signs and the nursing notes.  Pertinent labs & imaging results that were available during my care of the patient were reviewed by me and considered in my medical decision making (see chart for details).     Patient presents with coccygeal pain when sitting in certain positions status post slip and fall 4 days ago directly on her buttocks.  Xray without acute fracture, good rectal tone, no focal deficits noted. Discharge home with conservative management and PCP follow up.  Discussed strict return precautions and advised to return to the emergency department if experiencing any new or worsening symptoms. Instructions were understood and patient agreed with discharge plan.  Final Clinical Impressions(s) / ED Diagnoses   Final diagnoses:  Contusion of coccyx, subsequent encounter    New Prescriptions Discharge Medication List as of 08/03/2016  4:50 PM    START taking these medications   Details  naproxen (NAPROSYN) 500 MG tablet Take 1 tablet (500 mg total) by mouth 2 (two) times daily with a meal., Starting Sat 08/03/2016, Print       I personally performed the services described in this documentation, which was scribed in my presence. The recorded information has been reviewed and is accurate.     Georgiana Shore, PA-C 08/03/16 2005    Benjiman Core, MD 08/03/16 [phone]

## 2016-08-03 NOTE — Discharge Instructions (Signed)
As discussed, use ice and naproxen for pain. Follow up with your primary care as needed. follow up with ortho if you continue to experience pain in your shoulder. Return to the emergency department if you experience numbness in your legs, loose control of your bladder or bowel, fever or other concerning associated symptoms.

## 2016-08-03 NOTE — ED Triage Notes (Signed)
Patient here with complaints of tailbone pain and right rib pain after fall on 3/13. Was seen at the urgent care on 3/13 for right should pain.

## 2017-01-29 IMAGING — US US OB TRANSVAGINAL
1 series · 13 of 28 positions shown · non-contrast
Comparison: None.

CLINICAL DATA: Pregnant patient with abdominal, pelvic and back
pain. Last menstrual period 05/11/2014. Beta HCG is pending.

EXAM:
OBSTETRIC <14 WK US AND TRANSVAGINAL OB US
TECHNIQUE: Both transabdominal and transvaginal ultrasound examinations were
performed for complete evaluation of the gestation as well as the
maternal uterus, adnexal regions, and pelvic cul-de-sac.
Transvaginal technique was performed to assess early pregnancy.

[Series 1: us ob comp less 14 wks · 13 of 49 slices shown]
[im 2/49]
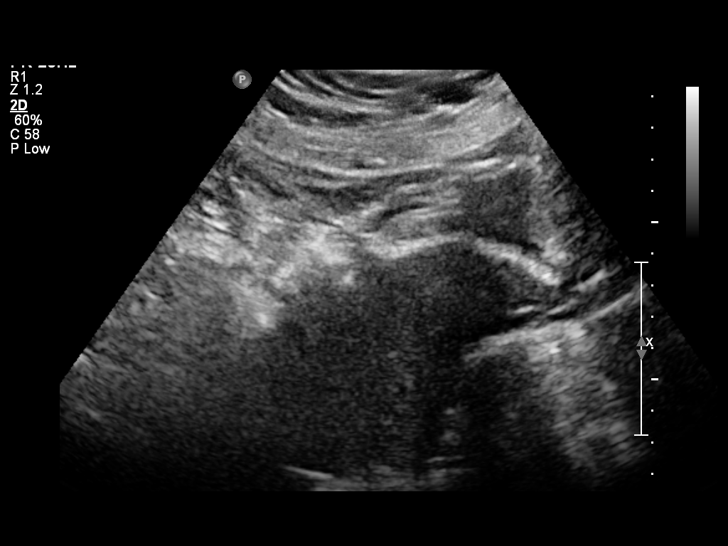
[im 6/49]
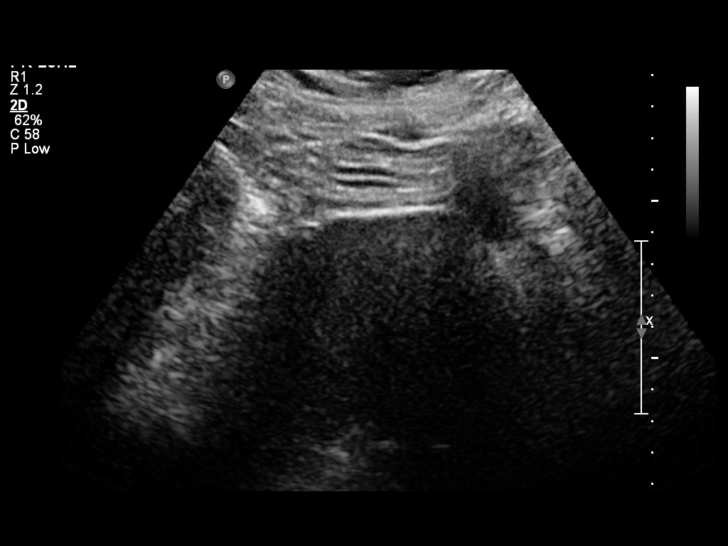
[im 9/49]
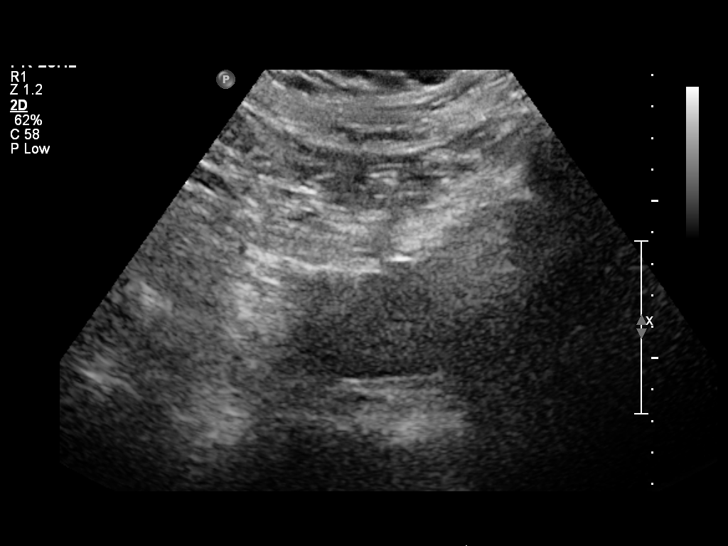
[im 13/49]
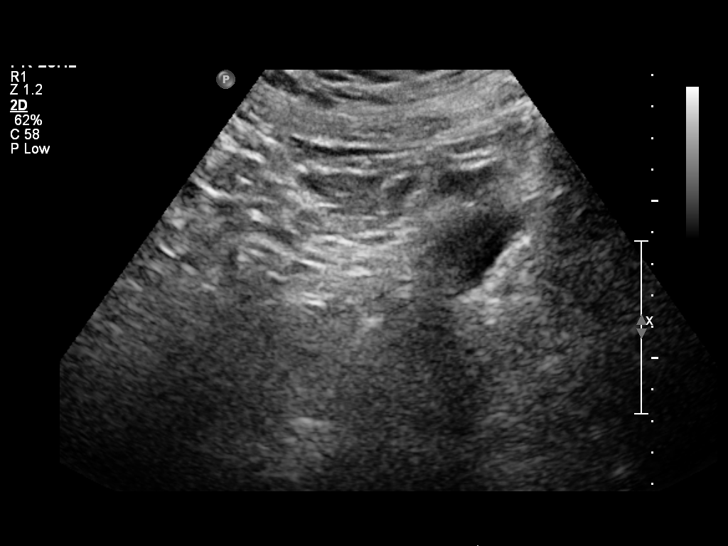
[im 17/49]
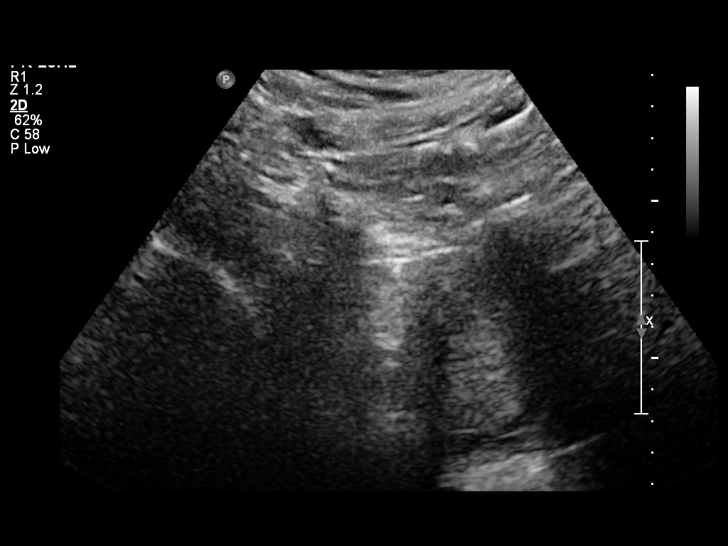
[im 20/49]
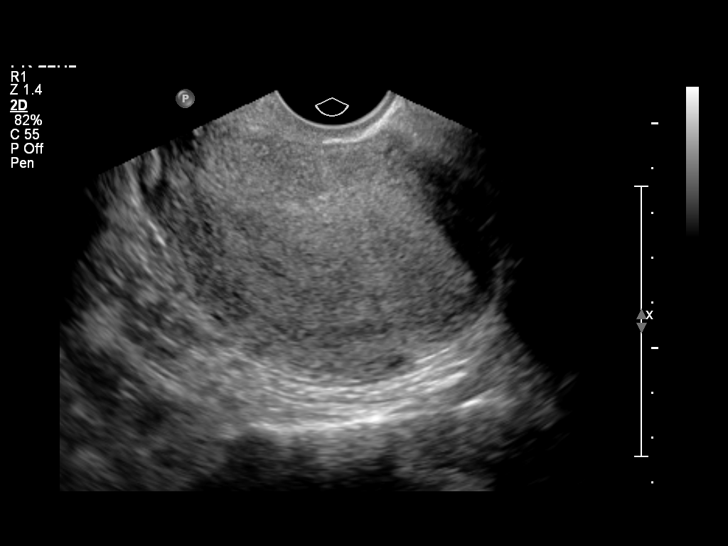
[im 25/49]
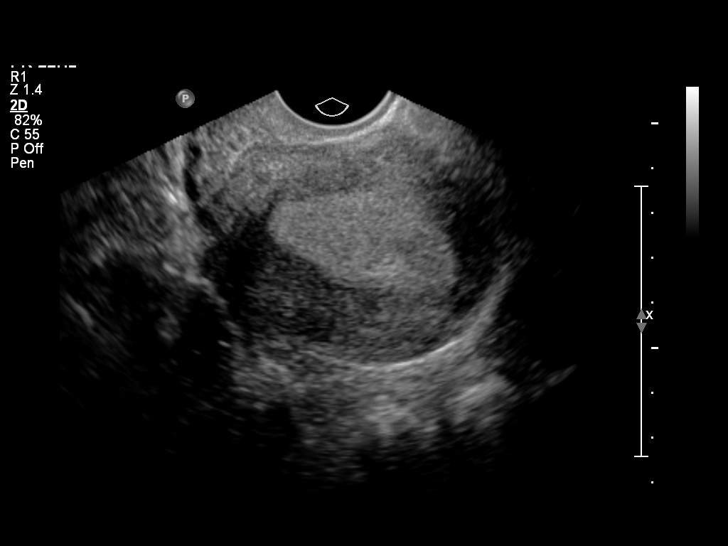
[im 29/49]
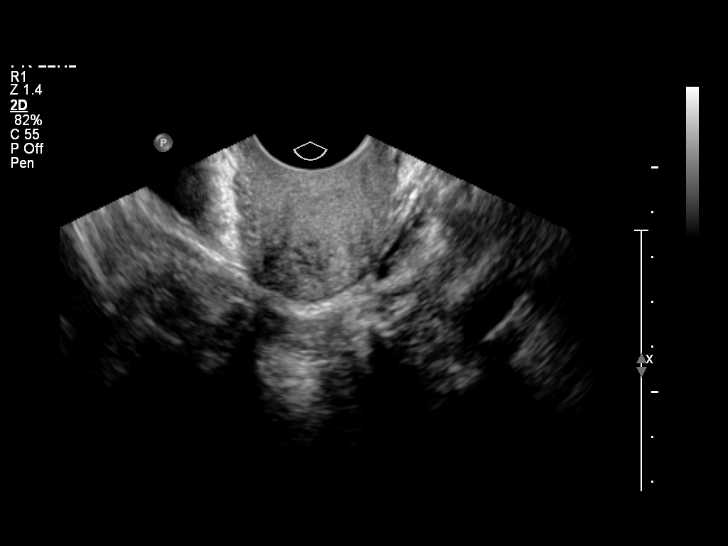
[im 33/49]
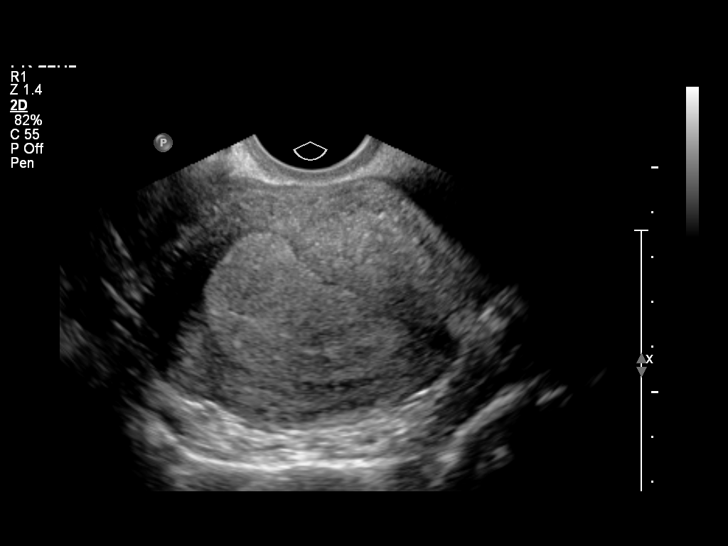
[im 36/49]
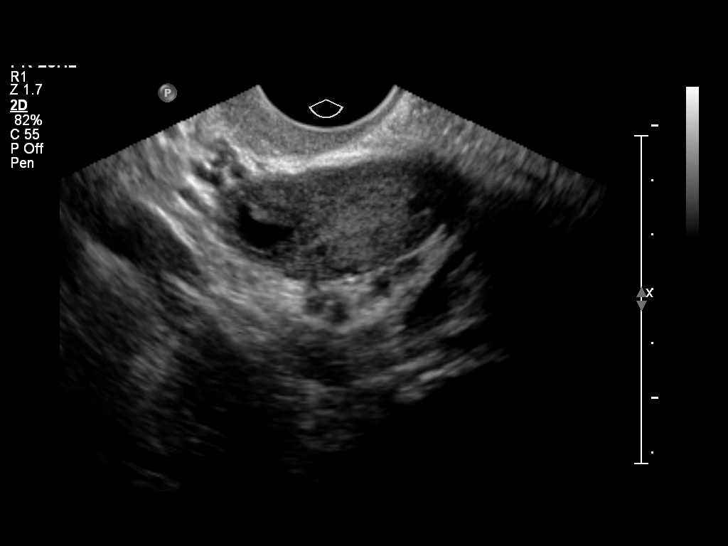
[im 40/49]
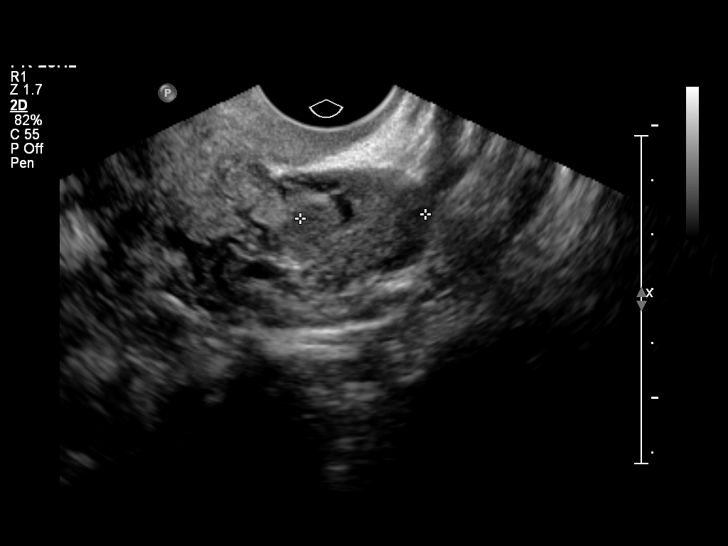
[im 43/49]
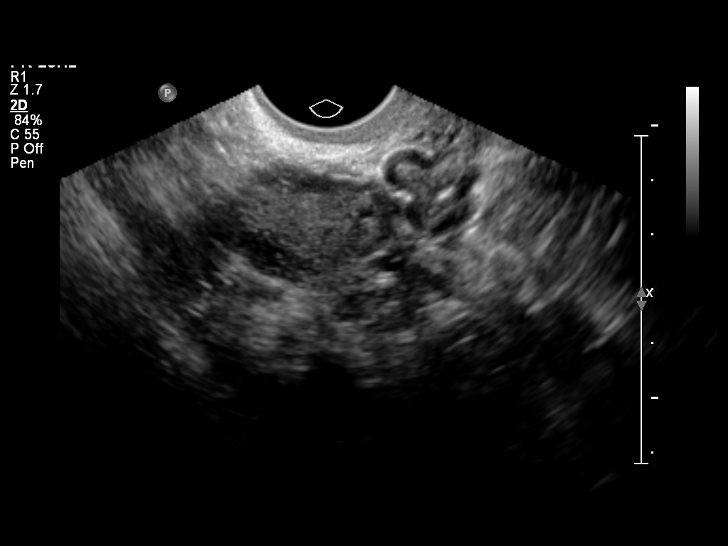
[im 47/49]
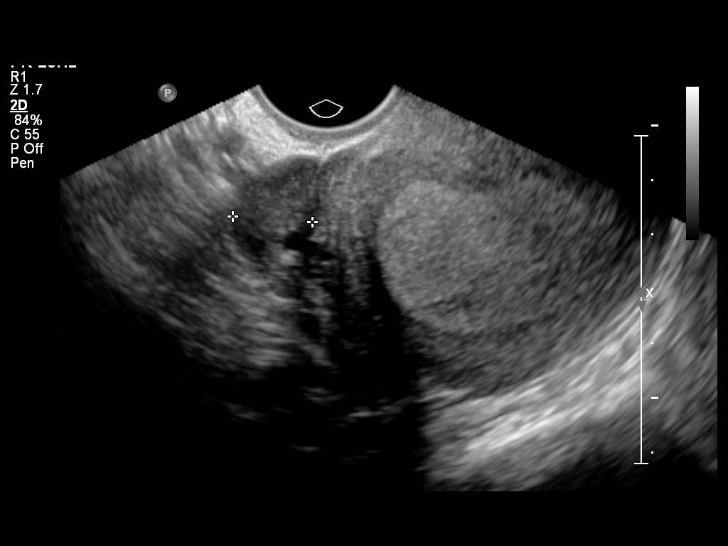

[13 of 28 positions shown; findings below may reference images not displayed]

FINDINGS: The uterus is retroverted. The endometrium is markedly thickened
measuring 3.2 cm. There is no intrauterine gestational sac. No fluid
in the endometrial canal. The left ovary measures 2.3 x 4.5 x 2.8 cm
and contains a 1.6 x 1.5 x 1.6 cm cyst, likely corpus luteum. Right
ovary measures 1.5 x 3.3 x 2.1 cm and appears normal. There is no
pelvic free fluid.
IMPRESSION: No intrauterine pregnancy. The endometrium is markedly thickened at
3.2 cm. This can be seen in very early pregnancy not yet detected
sonographically. There is a 1.6 cm cyst in the left ovary likely a
corpus luteum. There are no findings of ectopic pregnancy. Trending
of beta HCG as well as follow-up ultrasound in 10-14 days is
recommended.

## 2017-02-07 IMAGING — US US OB TRANSVAGINAL
1 series · 14 of 28 positions shown · non-contrast
Comparison: 06/21/2014

CLINICAL DATA: Abdominal pain in pregnancy.

EXAM:
TRANSVAGINAL OB ULTRASOUND
TECHNIQUE: Transvaginal ultrasound was performed for complete evaluation of the
gestation as well as the maternal uterus, adnexal regions, and
pelvic cul-de-sac.

[Series 1: us ob transvaginal · 14 of 37 slices shown]
[im 2/37]
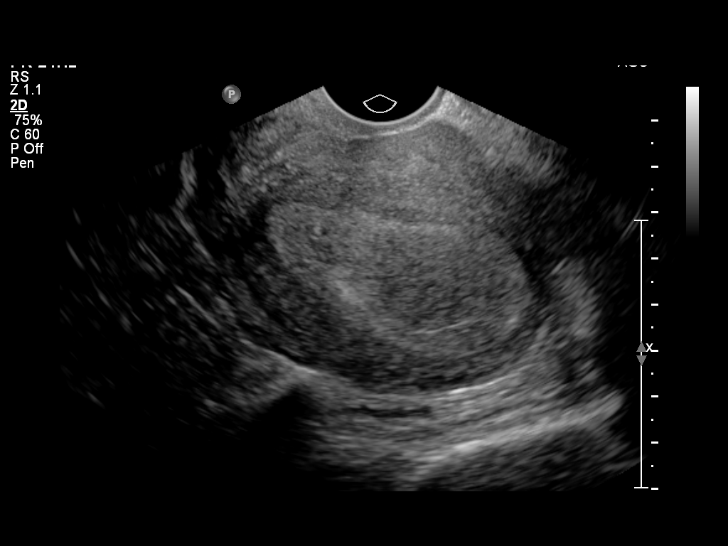
[im 5/37]
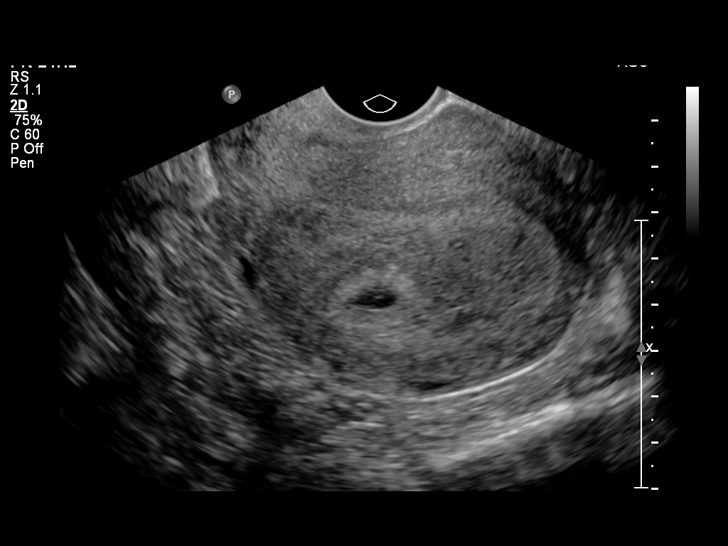
[im 7/37]
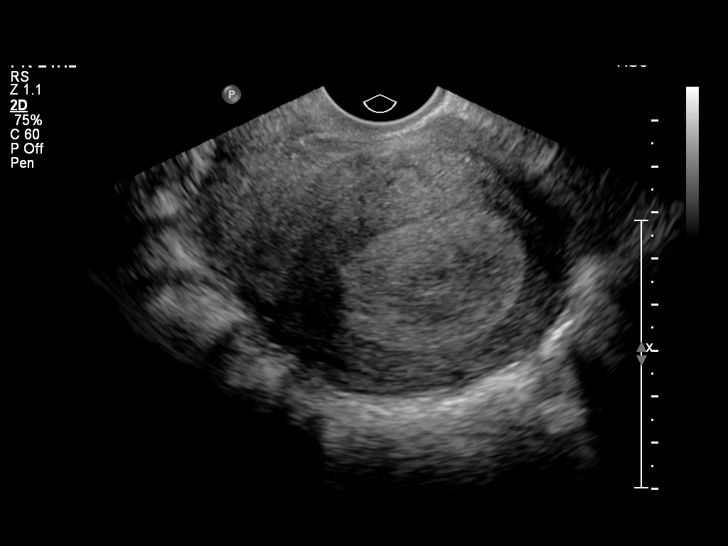
[im 10/37]
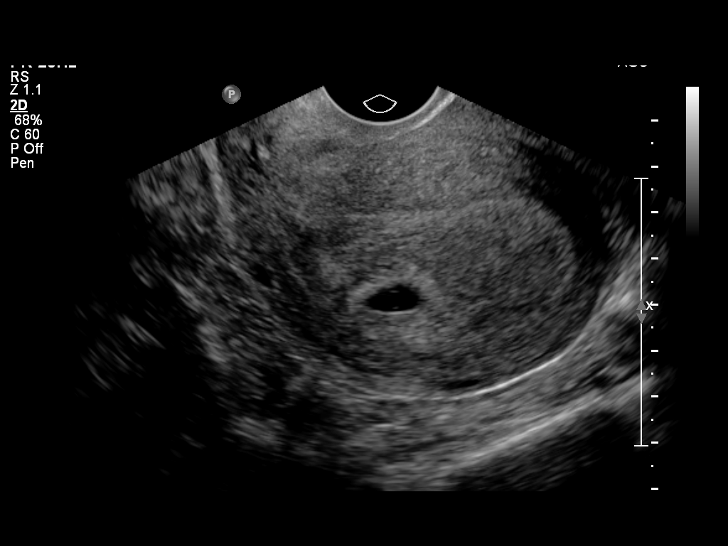
[im 13/37]
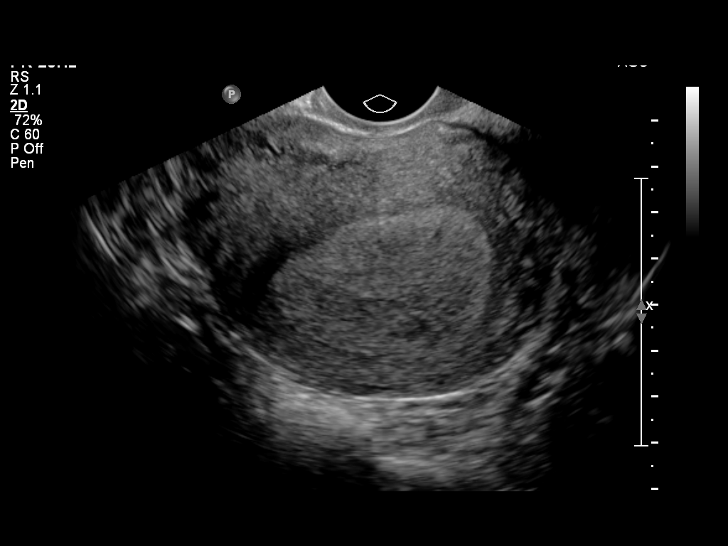
[im 15/37]
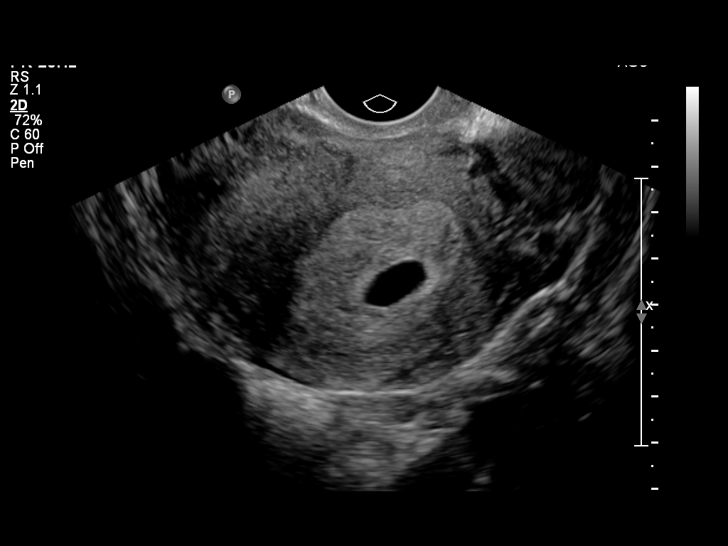
[im 18/37]
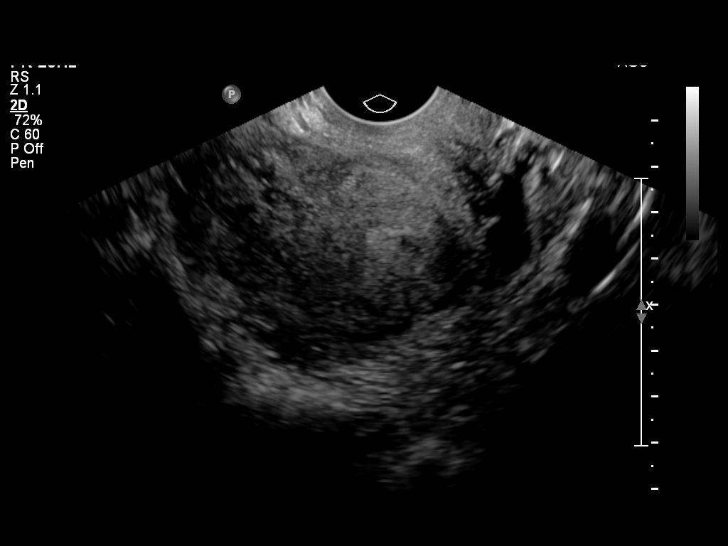
[im 21/37]
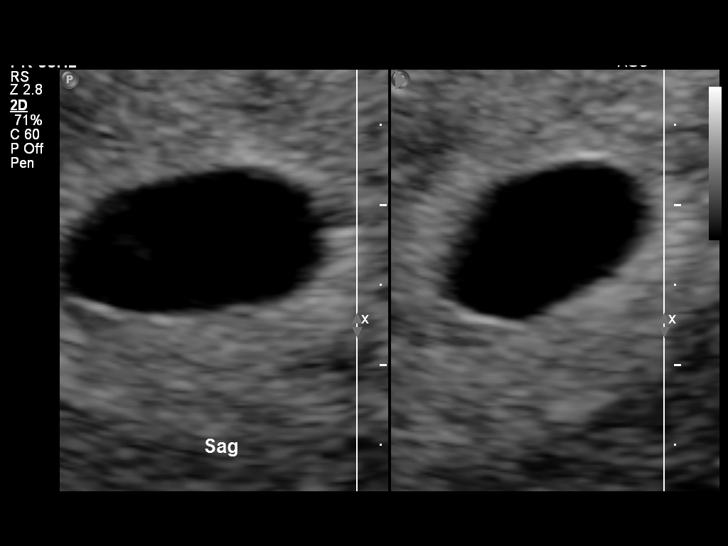
[im 23/37]
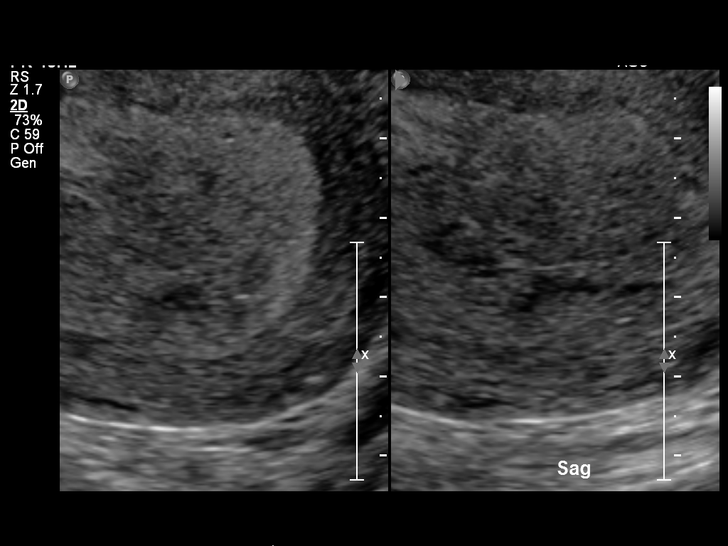
[im 26/37]
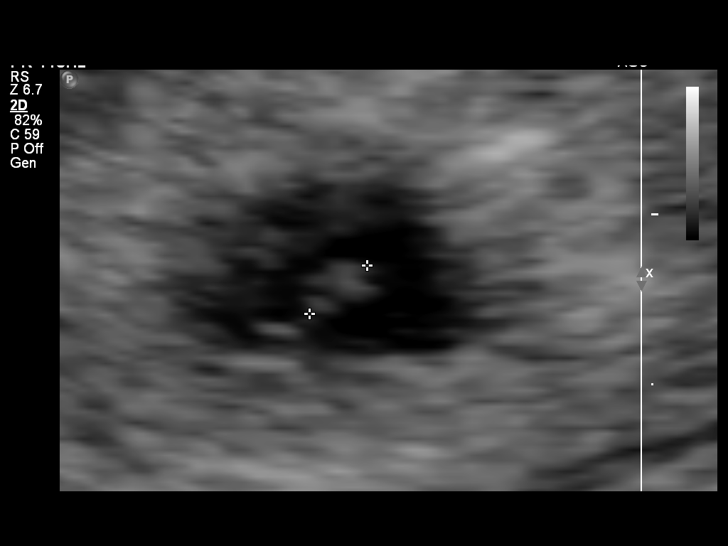
[im 29/37]
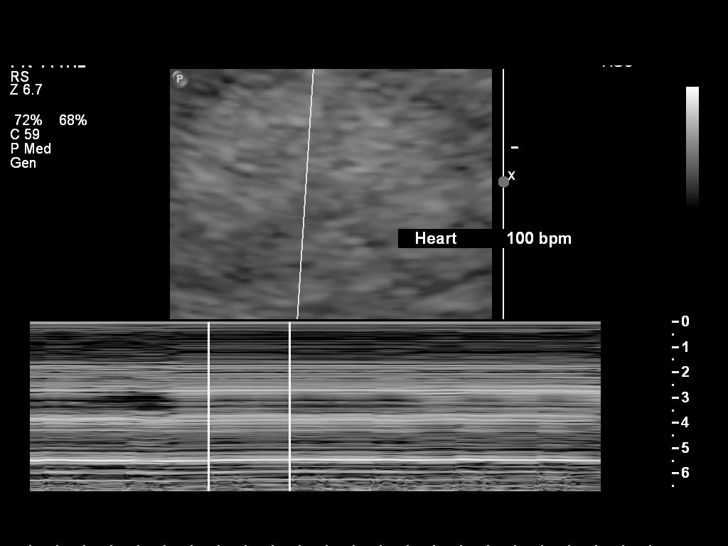
[im 31/37]
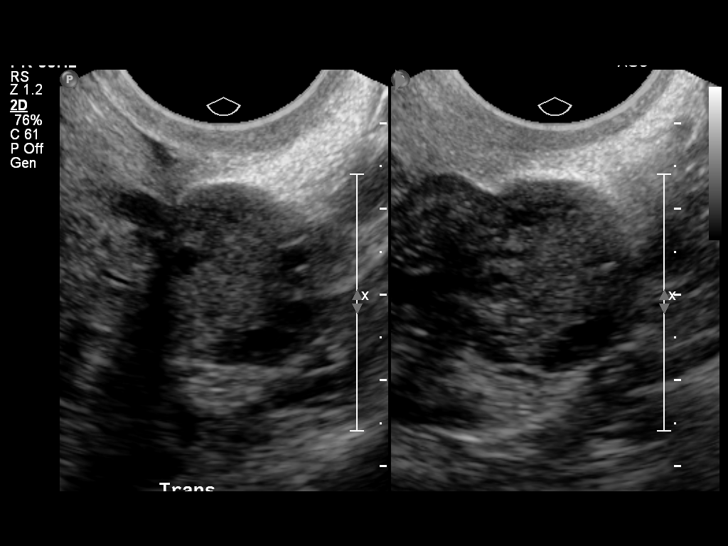
[im 34/37]
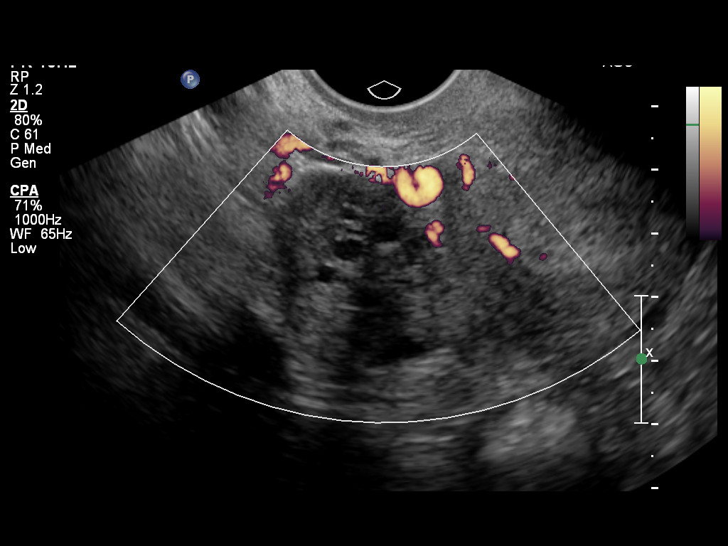
[im 37/37]
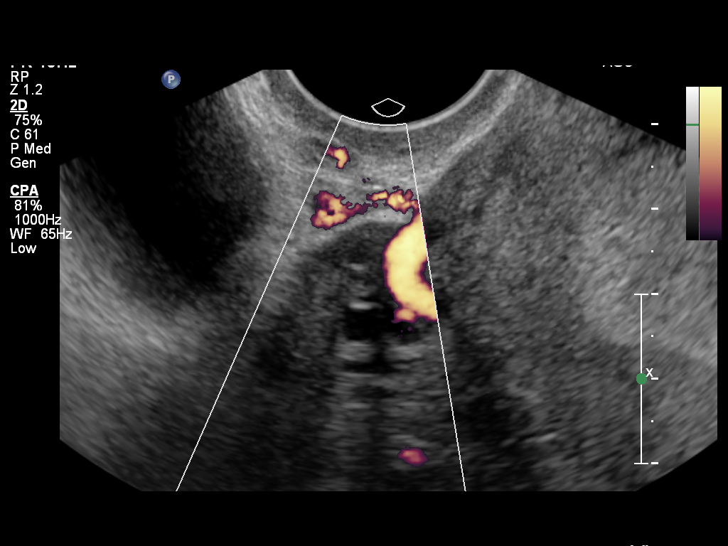

[14 of 28 positions shown; findings below may reference images not displayed]

FINDINGS: Intrauterine gestational sac: Visualized/normal in shape.

Yolk sac:  Visualized

Embryo:  Visualized

Cardiac Activity: Visualized

Heart Rate: 100 bpm

CRL:   2  mm   5 w 6 d                  US EDC: 02/24/2015

Maternal uterus/adnexae: Retroverted uterus. Small subchorionic
hemorrhage noted. Both ovaries are normal in appearance. No mass or
free fluid identified.
IMPRESSION: Single living IUP measuring 5 weeks 6 days with US EDC of
02/24/2015.

Small subchorionic hemorrhage noted.

## 2017-03-06 ENCOUNTER — Encounter (HOSPITAL_COMMUNITY): Payer: Self-pay | Admitting: Emergency Medicine

## 2017-03-06 ENCOUNTER — Ambulatory Visit (INDEPENDENT_AMBULATORY_CARE_PROVIDER_SITE_OTHER): Payer: BLUE CROSS/BLUE SHIELD

## 2017-03-06 ENCOUNTER — Ambulatory Visit (HOSPITAL_COMMUNITY)
Admission: EM | Admit: 2017-03-06 | Discharge: 2017-03-06 | Disposition: A | Discharge status: Home / Self Care | Payer: BLUE CROSS/BLUE SHIELD | Attending: Emergency Medicine | Admitting: Emergency Medicine

## 2017-03-06 DIAGNOSIS — M79671 Pain in right foot: Secondary | ICD-10-CM

## 2017-03-06 DIAGNOSIS — M25562 Pain in left knee: Secondary | ICD-10-CM

## 2017-03-06 DIAGNOSIS — S9031XA Contusion of right foot, initial encounter: Secondary | ICD-10-CM

## 2017-03-06 NOTE — Discharge Instructions (Signed)
Advised to use Ace wrap to left knee. Keep Ace wrap to Rt foot and left knee  x 2 weeks.  Ice pack x 10-15 min Q 4-6 hrs x 48 hrs. Ibuprofen as needed for pain. Keep foot elevated and keep weight of left foot.
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
## 2017-03-14 ENCOUNTER — Encounter (HOSPITAL_COMMUNITY): Payer: Self-pay | Admitting: *Deleted

## 2017-03-14 ENCOUNTER — Emergency Department (HOSPITAL_COMMUNITY)
Admission: EM | Admit: 2017-03-14 | Discharge: 2017-03-14 | Disposition: A | Discharge status: Home / Self Care | Payer: Self-pay | Attending: Emergency Medicine | Admitting: Emergency Medicine

## 2017-03-14 DIAGNOSIS — M5441 Lumbago with sciatica, right side: Secondary | ICD-10-CM | POA: Insufficient documentation

## 2017-03-14 LAB — I-STAT BETA HCG BLOOD, ED (MC, WL, AP ONLY)

## 2017-03-14 MED ORDER — NAPROXEN 500 MG PO TABS: 500.0000 mg | ORAL_TABLET | Freq: Two times a day (BID) | ORAL | 0 refills | Status: DC

## 2017-03-14 MED ORDER — METHOCARBAMOL 750 MG PO TABS: 750.0000 mg | ORAL_TABLET | Freq: Two times a day (BID) | ORAL | 0 refills | Status: DC | PRN

## 2017-03-14 NOTE — ED Provider Notes (Signed)
[facility]-EMERGENCY DEPT Provider Note   CSN: [id number] Arrival date & time: 03/14/17  4098     History   Chief Complaint Chief Complaint  Patient presents with  . Back Pain    HPI Leslie Hatfield is a 31 y.o. female.  Leslie Hatfield is a 31 y.o. Female who presents to the ED complaining of bilateral low back pain that is worse on her right side for the past 3 days.  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  She reports she occasionally has problems with her back.  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  She reports her pain is across her low back but is worse on her right side and radiates into her right posterior buttocks.  Her pain is worse with movement and walking and bending.  She tried ibuprofen and Flexeril at home with little relief.  She denies any falls or injury to her back.  She denies history of cancer or IV drug use.  No saddle anesthesia. She denies fevers, chest pain, abdominal pain, nausea, vomiting, urinary symptoms, vaginal bleeding, vaginal discharge, loss of bladder control, loss of bowel control, difficulty urinating, numbness, tingling or weakness.   The history is provided by the patient and medical records. No language interpreter was used.  Back Pain   Pertinent negatives include no fever, no numbness, no abdominal pain, no dysuria and no weakness.    Past Medical History:  Diagnosis Date  . Anemia   . No pertinent past medical history     Patient Active Problem List   Diagnosis Date Noted  . S/P cesarean section 02/18/2015    Past Surgical History:  Procedure Laterality Date  . CESAREAN SECTION  12/04/2010   Procedure: CESAREAN SECTION;  Surgeon: Oliver Pila;  Location: [facility];  Service: Gynecology;  Laterality: N/A;  . CESAREAN SECTION    . CESAREAN SECTION N/A 02/18/2015   Procedure: CESAREAN SECTION;   Surgeon: Lavina Hamman, MD;  Location: [facility];  Service: Obstetrics;  Laterality: N/A;  . vaginal poloyps    . vaginal polyps removed      OB History    Gravida Para Term Preterm AB Living   4 2 2   2 1    SAB TAB Ectopic Multiple Live Births   2     0 1       Home Medications    Prior to Admission medications   Medication Sig Start Date End Date Taking? Authorizing Provider  IUD'S IU 1 each by Intrauterine route continuous.    [provider]  methocarbamol (ROBAXIN) 750 MG tablet Take 1 tablet (750 mg total) by mouth 2 (two) times daily as needed for muscle spasms. 03/14/17   Everlene Farrier, PA-C  naproxen (NAPROSYN) 500 MG tablet Take 1 tablet (500 mg total) by mouth 2 (two) times daily with a meal. 03/14/17   Everlene Farrier, PA-C    Family History Family History  Problem Relation Age of Onset  . Heart disease Maternal Grandmother   . Heart disease Maternal Grandfather   . Diabetes Paternal Grandfather   . Diabetes Paternal Grandmother     Social History Social History  Substance Use Topics  . Smoking status: Never Smoker  . Smokeless tobacco: Never Used  . Alcohol use No     Allergies   Patient has no known allergies.  Review of Systems Review of Systems  Constitutional: Negative for chills and fever.  Gastrointestinal: Negative for abdominal pain, nausea and vomiting.  Genitourinary: Negative for decreased urine volume, difficulty urinating, dysuria, flank pain, frequency, genital sores, hematuria, urgency, vaginal bleeding and vaginal discharge.  Musculoskeletal: Positive for back pain. Negative for neck pain.  Skin: Negative for rash and wound.  Neurological: Negative for weakness and numbness.     Physical Exam Updated Vital Signs BP 134/87   Pulse (!) 110   Temp 97.8 F (36.6 C) (Oral)   Resp 14   Ht 5\' 4"  (1.626 m)   Wt 108.9 kg (240 lb)   SpO2 97%   BMI 41.20 kg/m   Physical Exam  Constitutional: She appears well-developed  and well-nourished. No distress.  Nontoxic appearing.  Obese female.  HENT:  Head: Normocephalic and atraumatic.  Eyes: Right eye exhibits no discharge. Left eye exhibits no discharge.  Neck: Neck supple.  Cardiovascular: Normal rate, regular rhythm, normal heart sounds and intact distal pulses.   HR 92. Bilateral radial, posterior tibialis and dorsalis pedis pulses are intact.    Pulmonary/Chest: Effort normal and breath sounds normal. No respiratory distress.  Abdominal: Soft. There is no tenderness. There is no guarding.  Musculoskeletal: Normal range of motion. She exhibits tenderness. She exhibits no edema or deformity.  No midline neck or back tenderness.  Patient has mild tenderness across her bilateral low back musculature as well as into the top of her right buttocks.  No overlying skin changes.  No back erythema, deformity or ecchymosis.  No crepitus.  She has good strength to her bilateral lower extremities.  No lower extremity edema or tenderness.  Neurological: She is alert. She displays normal reflexes. No sensory deficit. She exhibits normal muscle tone. Coordination normal.  Bilateral patellar DTRs are intact.  No foot drop.  Normal gait.  Skin: Skin is warm and dry. Capillary refill takes less than 2 seconds. No rash noted. She is not diaphoretic. No erythema. No pallor.  Psychiatric: She has a normal mood and affect. Her behavior is normal.  Nursing note and vitals reviewed.    ED Treatments / Results  Labs (all labs ordered are listed, but only abnormal results are displayed) Labs Reviewed  I-STAT BETA HCG BLOOD, ED ([facility], [facility], [facility] ONLY)    EKG  EKG Interpretation None       Radiology No results found.  Procedures Procedures (including critical care time)  Medications Ordered in ED Medications - No data to display   Initial Impression / Assessment and Plan / ED Course  I have reviewed the triage vital signs and the nursing notes.  Pertinent labs & imaging  results that were available during my care of the patient were reviewed by me and considered in my medical decision making (see chart for details).     This is a 31 y.o. Female who presents to the ED complaining of bilateral low back pain that is worse on her right side for the past 3 days.  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  She reports she occasionally has problems with her back.  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  She reports her pain is across her low back but is worse on her right side and radiates into her right posterior buttocks.  Her pain is worse with movement and walking and bending.  Patient with back pain.  No neurological deficits and normal neuro exam.  Patient can walk but states is painful.  No loss of bowel or bladder control.  No concern for cauda equina.  No fever, night sweats, weight loss, h/o cancer, IVDU.  RICE protocol and pain medicine indicated and discussed with patient.  Will do a course of naproxen and Robaxin.  I discussed back exercises.  Return precautions discussed. I advised the patient to follow-up with their primary care provider this week. I advised the patient to return to the emergency department with new or worsening symptoms or new concerns. The patient verbalized understanding and agreement with plan.     Final Clinical Impressions(s) / ED Diagnoses   Final diagnoses:  Acute right-sided low back pain with right-sided sciatica    New Prescriptions New Prescriptions   METHOCARBAMOL (ROBAXIN) 750 MG TABLET    Take 1 tablet (750 mg total) by mouth 2 (two) times daily as needed for muscle spasms.   NAPROXEN (NAPROSYN) 500 MG TABLET    Take 1 tablet (500 mg total) by mouth 2 (two) times daily with a meal.     Everlene FarrierDansie, Kailynne Ferrington, PA-C 03/14/17 1152    Dione BoozeGlick, David, MD 03/14/17 [id number]

## 2017-03-14 NOTE — ED Notes (Signed)
Pt is alert and oriented x 4 and is verbally responsive. Pt mother is present during this assessment.

## 2017-03-14 NOTE — Discharge Instructions (Signed)
Pregnancy test was negative. Good luck with school!

## 2017-03-14 NOTE — ED Notes (Signed)
Bed: WTR8 Expected date:  Expected time:  Means of arrival:  Comments: 

## 2017-03-14 NOTE — ED Triage Notes (Signed)
Pt complains of lower back pain x 3 days. Pt states she tried tylenol, ibuprofen, flexeril, tramadol with no relief. Pain is worse with movement.

## 2017-06-09 DIAGNOSIS — L2089 Other atopic dermatitis: Secondary | ICD-10-CM | POA: Diagnosis not present

## 2017-06-09 DIAGNOSIS — Z1389 Encounter for screening for other disorder: Secondary | ICD-10-CM | POA: Diagnosis not present

## 2017-06-09 DIAGNOSIS — Z01419 Encounter for gynecological examination (general) (routine) without abnormal findings: Secondary | ICD-10-CM | POA: Diagnosis not present

## 2017-06-09 DIAGNOSIS — Z130 Encounter for screening for diseases of the blood and blood-forming organs and certain disorders involving the immune mechanism: Secondary | ICD-10-CM | POA: Diagnosis not present

## 2017-06-09 DIAGNOSIS — Z6841 Body Mass Index (BMI) 40.0 and over, adult: Secondary | ICD-10-CM | POA: Diagnosis not present

## 2017-06-09 DIAGNOSIS — Z124 Encounter for screening for malignant neoplasm of cervix: Secondary | ICD-10-CM | POA: Diagnosis not present

## 2017-06-09 DIAGNOSIS — R635 Abnormal weight gain: Secondary | ICD-10-CM | POA: Diagnosis not present

## 2017-06-10 DIAGNOSIS — Z124 Encounter for screening for malignant neoplasm of cervix: Secondary | ICD-10-CM | POA: Diagnosis not present

## 2017-06-16 DIAGNOSIS — J029 Acute pharyngitis, unspecified: Secondary | ICD-10-CM | POA: Diagnosis not present

## 2017-06-16 DIAGNOSIS — J040 Acute laryngitis: Secondary | ICD-10-CM | POA: Diagnosis not present

## 2017-06-16 DIAGNOSIS — J020 Streptococcal pharyngitis: Secondary | ICD-10-CM | POA: Diagnosis not present

## 2017-06-30 ENCOUNTER — Encounter (HOSPITAL_COMMUNITY): Payer: Self-pay

## 2017-06-30 ENCOUNTER — Emergency Department (HOSPITAL_COMMUNITY)
Admission: EM | Admit: 2017-06-30 | Discharge: 2017-06-30 | Disposition: A | Discharge status: Home / Self Care | Payer: BLUE CROSS/BLUE SHIELD | Attending: Emergency Medicine | Admitting: Emergency Medicine

## 2017-06-30 ENCOUNTER — Other Ambulatory Visit: Payer: Self-pay

## 2017-06-30 DIAGNOSIS — J029 Acute pharyngitis, unspecified: Secondary | ICD-10-CM | POA: Diagnosis not present

## 2017-06-30 DIAGNOSIS — B9789 Other viral agents as the cause of diseases classified elsewhere: Secondary | ICD-10-CM | POA: Diagnosis not present

## 2017-06-30 DIAGNOSIS — Z79899 Other long term (current) drug therapy: Secondary | ICD-10-CM | POA: Diagnosis not present

## 2017-06-30 DIAGNOSIS — J028 Acute pharyngitis due to other specified organisms: Secondary | ICD-10-CM | POA: Diagnosis not present

## 2017-06-30 DIAGNOSIS — R05 Cough: Secondary | ICD-10-CM | POA: Diagnosis not present

## 2017-06-30 LAB — RAPID STREP SCREEN (MED CTR MEBANE ONLY): STREPTOCOCCUS, GROUP A SCREEN (DIRECT): NEGATIVE

## 2017-06-30 MED ORDER — LIDOCAINE VISCOUS 2 % MT SOLN: 15.0000 mL | OROMUCOSAL | 0 refills | Status: DC | PRN

## 2017-06-30 MED ORDER — DEXAMETHASONE SODIUM PHOSPHATE 10 MG/ML IJ SOLN
10.0000 mg | Freq: Once | INTRAMUSCULAR | Status: AC
  Administered 2017-06-30: 10 mg via INTRAMUSCULAR
  Filled 2017-06-30: qty 1

## 2017-06-30 NOTE — Discharge Instructions (Signed)
Please read attached information regarding your condition. Swish and spit lidocaine solution to help with throat discomfort.  Do not swallow. We will contact you if your strep culture is positive.  Your rapid strep screen was negative today. Take Tylenol and ibuprofen as needed for pain and fevers. Follow-up with your primary care provider or the wellness center listed below for further evaluation. Return to ED for worsening symptoms, trouble breathing, trouble swallowing, wheezing, lightheadedness or loss of consciousness.

## 2017-06-30 NOTE — ED Triage Notes (Signed)
Pt Dx with Strep two weeks ago, given ABX, finished dosing with some resolve, symptoms now worsening.

## 2017-06-30 NOTE — ED Provider Notes (Signed)
[facility]-EMERGENCY DEPT Provider Note   CSN: [id number] Arrival date & time: 06/30/17  [id number]     History   Chief Complaint Chief Complaint  Patient presents with  . Sore Throat    HPI Leslie Hatfield is a 32 y.o. female with no significant past medical history, who presents to ED for evaluation of history of sore throat.  States that symptoms began last night and has been associated with a mild dry cough.  She was diagnosed with strep throat approximately 2 weeks ago and finished her prescription of antibiotics for 3-4 days ago.  She states that symptoms resolved with medication however, shortly return after the course had ended.  She has not tried any over-the-counter medications to help with symptoms.  30-year-old son at home with similar symptoms.  She denies any trouble breathing, trouble swallowing, drooling, fevers, chest pain, nausea, vomiting, abdominal pain, fatigue, nasal congestion or other URI symptoms.  HPI  Past Medical History:  Diagnosis Date  . Anemia   . No pertinent past medical history     Patient Active Problem List   Diagnosis Date Noted  . S/P cesarean section 02/18/2015    Past Surgical History:  Procedure Laterality Date  . CESAREAN SECTION  12/04/2010   Procedure: CESAREAN SECTION;  Surgeon: Oliver PilaKathy W Richardson;  Location: [facility];  Service: Gynecology;  Laterality: N/A;  . CESAREAN SECTION    . CESAREAN SECTION N/A 02/18/2015   Procedure: CESAREAN SECTION;  Surgeon: Lavina Hammanodd Meisinger, MD;  Location: [facility];  Service: Obstetrics;  Laterality: N/A;  . vaginal poloyps    . vaginal polyps removed      OB History    Gravida Para Term Preterm AB Living   4 2 2   2 1    SAB TAB Ectopic Multiple Live Births   2     0 1       Home Medications    Prior to Admission medications   Medication Sig Start Date End Date Taking? Authorizing Provider  diphenhydramine-acetaminophen (TYLENOL PM) 25-500 MG TABS tablet Take 2 tablets by mouth  at bedtime as needed (sleep,pain).   Yes [provider]  IUD'S IU 1 each by Intrauterine route continuous.   Yes [provider]  phenol (CHLORASEPTIC) 1.4 % LIQD Use as directed 2 sprays in the mouth or throat as needed for throat irritation / pain.   Yes [provider]  lidocaine (XYLOCAINE) 2 % solution Use as directed 15 mLs in the mouth or throat as needed for mouth pain. 06/30/17   Lanaiya Lantry, PA-C  methocarbamol (ROBAXIN) 750 MG tablet Take 1 tablet (750 mg total) by mouth 2 (two) times daily as needed for muscle spasms. Patient not taking: Reported on 06/30/2017 03/14/17   Everlene Farrieransie, William, PA-C  naproxen (NAPROSYN) 500 MG tablet Take 1 tablet (500 mg total) by mouth 2 (two) times daily with a meal. Patient not taking: Reported on 06/30/2017 03/14/17   Everlene Farrieransie, William, PA-C    Family History Family History  Problem Relation Age of Onset  . Heart disease Maternal Grandmother   . Heart disease Maternal Grandfather   . Diabetes Paternal Grandfather   . Diabetes Paternal Grandmother     Social History Social History   Tobacco Use  . Smoking status: Never Smoker  . Smokeless tobacco: Never Used  Substance Use Topics  . Alcohol use: No  . Drug use: No     Allergies   Patient has no known allergies.  Review of Systems Review of Systems  Constitutional: Negative for chills and fever.  HENT: Positive for sore throat. Negative for congestion, dental problem, drooling, facial swelling, hearing loss, nosebleeds, postnasal drip, rhinorrhea, sinus pressure, tinnitus, trouble swallowing and voice change.   Respiratory: Positive for cough.   Cardiovascular: Negative for chest pain.  Gastrointestinal: Negative for nausea and vomiting.     Physical Exam Updated Vital Signs BP (!) 146/99   Pulse 92   Temp (!) 97.3 F (36.3 C) (Oral)   Resp 15   Ht 5\' 4"  (1.626 m)   Wt 108.9 kg (240 lb)   SpO2 99%   BMI 41.20 kg/m   Physical Exam    Constitutional: She appears well-developed and well-nourished. No distress.  Nontoxic appearing and in no acute distress.  Speaking in complete sentences without difficulty.  HENT:  Head: Normocephalic and atraumatic.  Right Ear: Tympanic membrane normal.  Left Ear: Tympanic membrane normal.  Nose: Mucosal edema present.  Mouth/Throat: Uvula is midline and mucous membranes are normal. No trismus in the jaw. No uvula swelling. Posterior oropharyngeal erythema present. Tonsils are 1+ on the right. Tonsils are 1+ on the left. No tonsillar exudate.  Bilaterally, symmetrically enlarged tonsils without exudates.  Oropharynx appears widely patent. Patient does not appear to be in acute distress. No trismus or drooling present. No pooling of secretions. Patient is tolerating secretions and is not in respiratory distress. No neck pain or tenderness to palpation of the neck. Full active and passive range of motion of the neck. No evidence of RPA or PTA.  Eyes: Conjunctivae and EOM are normal. No scleral icterus.  Neck: Normal range of motion.  Pulmonary/Chest: Effort normal. No respiratory distress.  Abdominal: There is no tenderness.  No abdominal tenderness to palpation.  Neurological: She is alert.  Skin: No rash noted. She is not diaphoretic.  Psychiatric: She has a normal mood and affect.  Nursing note and vitals reviewed.    ED Treatments / Results  Labs (all labs ordered are listed, but only abnormal results are displayed) Labs Reviewed  RAPID STREP SCREEN (NOT AT [facility])  CULTURE, GROUP A STREP [facility])    EKG  EKG Interpretation None       Radiology No results found.  Procedures Procedures (including critical care time)  Medications Ordered in ED Medications  dexamethasone (DECADRON) injection 10 mg (not administered)     Initial Impression / Assessment and Plan / ED Course  I have reviewed the triage vital signs and the nursing notes.  Pertinent labs & imaging results  that were available during my care of the patient were reviewed by me and considered in my medical decision making (see chart for details).     Patient presents to ED for evaluation of sore throat since last night.  Diagnosed and treated for strep 2 weeks ago with improvement in symptoms.  Strep test today negative.  No evidence of RPA or PTA on examination of posterior oropharynx.  She is overall well-appearing with no trouble breathing, trouble swallowing, trismus or drooling.  Tonsils are bilaterally, symmetrically enlarged with no exudates and oropharynx is widely patent.  I suspect that her symptoms are viral in nature.  Patient given Decadron here for symptomatic improvement, lidocaine to swish and spit at home and advised to follow-up with primary care for further evaluation.  Discussed antipyretics as needed for fever and pain.  Patient appears stable for discharge at this time.  Strict return precautions given.  Portions of this  note were generated with Scientist, clinical (histocompatibility and immunogenetics). Dictation errors may occur despite best attempts at proofreading.   Final Clinical Impressions(s) / ED Diagnoses   Final diagnoses:  Viral pharyngitis    ED Discharge Orders        Ordered    lidocaine (XYLOCAINE) 2 % solution  As needed     06/30/17 0907       Dietrich Pates, PA-C 06/30/17 6962    Wynetta Fines, MD 06/30/17 1540

## 2017-07-02 LAB — CULTURE, GROUP A STREP (THRC)

## 2017-07-03 ENCOUNTER — Telehealth: Payer: Self-pay | Admitting: Emergency Medicine

## 2017-07-03 NOTE — Telephone Encounter (Signed)
Post ED Visit - Positive Culture Follow-up: Successful Patient Follow-Up  Culture assessed and recommendations reviewed by: [x]  Enzo BiNathan Batchelder, Pharm.D. []  Celedonio MiyamotoJeremy Frens, Pharm.D., BCPS AQ-ID []  Garvin FilaMike Maccia, Pharm.D., BCPS []  Georgina PillionElizabeth Martin, Pharm.D., BCPS []  JolietMinh Pham, [address]Pharm.D., BCPS, AAHIVP []  Estella HuskMichelle Turner, Pharm.D., BCPS, AAHIVP []  Lysle Pearlachel Rumbarger, PharmD, BCPS []  Casilda Carlsaylor Stone, PharmD, BCPS []  Pollyann SamplesAndy Johnston, PharmD, BCPS  Positive strep culture  [x]  Patient discharged without antimicrobial prescription and treatment is now indicated []  Organism is resistant to prescribed ED discharge antimicrobial []  Patient with positive blood cultures  Changes discussed with ED provider: Fayrene HelperBowie Tran PA New antibiotic prescription start amoxicillin 500mg  po bid x 10 days  Attempting to contact patient   Berle MullMiller, Casee Knepp 07/03/2017, 12:40 PM

## 2017-07-03 NOTE — Progress Notes (Signed)
ED Antimicrobial Stewardship Positive Culture Follow Up   Leslie Hatfield is an 32 y.o. female who [facility] with a chief complaint of  Chief Complaint  Patient presents with  . Sore Throat    Recent Results (from the past 720 hour(s))  Rapid strep screen     Status: None   Collection Time: 06/30/17  8:01 AM  Result Value Ref Range Status   Streptococcus, Group A Screen (Direct) NEGATIVE NEGATIVE Final    Comment: (NOTE) A Rapid Antigen test may result negative if the antigen level in the sample is below the detection level of this test. The FDA has not cleared this test as a stand-alone test therefore the rapid antigen negative result has reflexed to a Group A Strep culture. Performed at [facility], [address] [place], [place] [address]   Culture, group A strep     Status: None   Collection Time: 06/30/17  8:01 AM  Result Value Ref Range Status   Specimen Description   Final    THROAT Performed at [facility], [address] [place], [place] [address]    Special Requests   Final    NONE Reflexed from [id number] Performed at [facility], [address] [place], [place] [address]    Culture MODERATE GROUP A STREP (S.PYOGENES) ISOLATED  Final   Report Status 07/02/2017 FINAL  Final    [x]  Patient discharged originally without antimicrobial agent and treatment is now indicated  New antibiotic prescription: Amoxicillin 500mg  PO BID x 10 days  ED Provider: Fayrene HelperBowie Tran PA-C   Armandina StammerBATCHELDER,Efrat Zuidema J 07/03/2017, 9:16 AM Infectious Diseases Pharmacist Phone# [phone]

## 2017-07-11 ENCOUNTER — Telehealth: Payer: Self-pay | Admitting: Emergency Medicine

## 2017-09-11 ENCOUNTER — Encounter (HOSPITAL_COMMUNITY): Payer: Self-pay

## 2017-09-11 ENCOUNTER — Emergency Department (HOSPITAL_COMMUNITY)
Admission: EM | Admit: 2017-09-11 | Discharge: 2017-09-11 | Disposition: A | Discharge status: Home / Self Care | Payer: BLUE CROSS/BLUE SHIELD | Attending: Emergency Medicine | Admitting: Emergency Medicine

## 2017-09-11 ENCOUNTER — Emergency Department (HOSPITAL_COMMUNITY): Payer: BLUE CROSS/BLUE SHIELD

## 2017-09-11 ENCOUNTER — Other Ambulatory Visit: Payer: Self-pay

## 2017-09-11 DIAGNOSIS — R000 Tachycardia, unspecified: Secondary | ICD-10-CM | POA: Diagnosis not present

## 2017-09-11 DIAGNOSIS — R0602 Shortness of breath: Secondary | ICD-10-CM | POA: Diagnosis not present

## 2017-09-11 DIAGNOSIS — Z79899 Other long term (current) drug therapy: Secondary | ICD-10-CM | POA: Insufficient documentation

## 2017-09-11 DIAGNOSIS — F410 Panic disorder [episodic paroxysmal anxiety] without agoraphobia: Secondary | ICD-10-CM | POA: Diagnosis not present

## 2017-09-11 DIAGNOSIS — R079 Chest pain, unspecified: Secondary | ICD-10-CM | POA: Diagnosis not present

## 2017-09-11 LAB — CBC WITH DIFFERENTIAL/PLATELET
BASOS ABS: 0 10*3/uL (ref 0.0–0.1)
BASOS PCT: 0 %
EOS ABS: 0.1 10*3/uL (ref 0.0–0.7)
EOS PCT: 1 %
HEMATOCRIT: 40.2 % (ref 36.0–46.0)
Hemoglobin: 13.2 g/dL (ref 12.0–15.0)
Lymphocytes Relative: 27 %
Lymphs Abs: 1.6 10*3/uL (ref 0.7–4.0)
MCH: 30.2 pg (ref 26.0–34.0)
MCHC: 32.8 g/dL (ref 30.0–36.0)
MCV: 92 fL (ref 78.0–100.0)
MONOS PCT: 6 %
Monocytes Absolute: 0.4 10*3/uL (ref 0.1–1.0)
Neutro Abs: 3.9 10*3/uL (ref 1.7–7.7)
Neutrophils Relative %: 66 %
PLATELETS: 266 10*3/uL (ref 150–400)
RBC: 4.37 MIL/uL (ref 3.87–5.11)
RDW: 12.9 % (ref 11.5–15.5)
WBC: 5.9 10*3/uL (ref 4.0–10.5)

## 2017-09-11 LAB — I-STAT TROPONIN, ED: TROPONIN I, POC: 0 ng/mL (ref 0.00–0.08)

## 2017-09-11 LAB — BASIC METABOLIC PANEL
Anion gap: 11 (ref 5–15)
BUN: 9 mg/dL (ref 6–20)
CALCIUM: 9.3 mg/dL (ref 8.9–10.3)
CO2: 23 mmol/L (ref 22–32)
Chloride: 101 mmol/L (ref 101–111)
Creatinine, Ser: 0.78 mg/dL (ref 0.44–1.00)
Glucose, Bld: 98 mg/dL (ref 65–99)
Potassium: 3.6 mmol/L (ref 3.5–5.1)
Sodium: 135 mmol/L (ref 135–145)

## 2017-09-11 LAB — D-DIMER, QUANTITATIVE: D-Dimer, Quant: 0.45 ug/mL-FEU (ref 0.00–0.50)

## 2017-09-11 MED ORDER — ASPIRIN 81 MG PO CHEW
324.0000 mg | CHEWABLE_TABLET | Freq: Once | ORAL | Status: AC
  Administered 2017-09-11: 324 mg via ORAL
  Filled 2017-09-11: qty 4

## 2017-09-11 MED ORDER — HYDROXYZINE HCL 25 MG PO TABS: 25.0000 mg | ORAL_TABLET | Freq: Four times a day (QID) | ORAL | 0 refills | Status: DC

## 2017-09-11 MED ORDER — HYDROXYZINE HCL 25 MG PO TABS
50.0000 mg | ORAL_TABLET | Freq: Once | ORAL | Status: AC
  Administered 2017-09-11: 50 mg via ORAL
  Filled 2017-09-11: qty 2

## 2017-09-11 NOTE — ED Triage Notes (Signed)
Pt presents for evaluation of L sided cp while driving today. Pt reports she believes this may be related to anxiety, has been stressed with school and work lately. Pt denies diaphoresis, sob. States she feels palpitations.

## 2017-09-11 NOTE — ED Provider Notes (Signed)
[facility] Provider Note   CSN: [id number] Arrival date & time: 09/11/17  1447   History   Chief Complaint Chief Complaint  Patient presents with  . Chest Pain    HPI Leslie Hatfield is a 32 y.o. female.  HPI   32 year old female presents today with complaints of chest pain shortness of breath.  Patient notes that she was driving down the road when she developed acute onset left-sided chest pain shortness of breath.  She notes this felt very similar to previous episodes of anxiety and panic attack but notes usually she does not have the sharp pain.  Patient notes very minor pain with inspiration that has improved since her arrival.  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  She notes that her heart rate is generally high.  Patient denies any personal or family cardiac history although she does not know her biological grandparents very well.  She denies any history DVT or PE, denies any significant risk factors.  Patient is a non-smoker.    Past Medical History:  Diagnosis Date  . Anemia   . No pertinent past medical history     Patient Active Problem List   Diagnosis Date Noted  . S/P cesarean section 02/18/2015    Past Surgical History:  Procedure Laterality Date  . CESAREAN SECTION  12/04/2010   Procedure: CESAREAN SECTION;  Surgeon: Oliver PilaKathy W Richardson;  Location: [facility];  Service: Gynecology;  Laterality: N/A;  . CESAREAN SECTION    . CESAREAN SECTION N/A 02/18/2015   Procedure: CESAREAN SECTION;  Surgeon: Lavina Hammanodd Meisinger, MD;  Location: [facility];  Service: Obstetrics;  Laterality: N/A;  . vaginal poloyps    . vaginal polyps removed       OB History    Gravida  4   Para  2   Term  2   Preterm      AB  2   Living  1     SAB  2   TAB      Ectopic      Multiple  0   Live Births  1            Home Medications    Prior to Admission medications   Medication  Sig Start Date End Date Taking? Authorizing Provider  diphenhydramine-acetaminophen (TYLENOL PM) 25-500 MG TABS tablet Take 2 tablets by mouth at bedtime as needed (sleep,pain).    [provider]  hydrOXYzine (ATARAX/VISTARIL) 25 MG tablet Take 1 tablet (25 mg total) by mouth every 6 (six) hours. 09/11/17   Ivory Maduro, Tinnie GensJeffrey, PA-C  IUD'S IU 1 each by Intrauterine route continuous.    [provider]  lidocaine (XYLOCAINE) 2 % solution Use as directed 15 mLs in the mouth or throat as needed for mouth pain. 06/30/17   Khatri, Hina, PA-C  methocarbamol (ROBAXIN) 750 MG tablet Take 1 tablet (750 mg total) by mouth 2 (two) times daily as needed for muscle spasms. Patient not taking: Reported on 06/30/2017 03/14/17   Everlene Farrieransie, William, PA-C  naproxen (NAPROSYN) 500 MG tablet Take 1 tablet (500 mg total) by mouth 2 (two) times daily with a meal. Patient not taking: Reported on 06/30/2017 03/14/17   Everlene Farrieransie, William, PA-C  phenol (CHLORASEPTIC) 1.4 % LIQD Use as directed 2 sprays in the mouth or throat as needed for throat irritation / pain.    [provider]    Family History Family History  Problem Relation Age of Onset  . Heart disease Maternal Grandmother   . Heart disease Maternal Grandfather   . Diabetes Paternal Grandfather   . Diabetes Paternal Grandmother     Social History Social History   Tobacco Use  . Smoking status: Never Smoker  . Smokeless tobacco: Never Used  Substance Use Topics  . Alcohol use: No  . Drug use: No    Allergies   Patient has no known allergies.   Review of Systems Review of Systems  All other systems reviewed and are negative.   Physical Exam Updated Vital Signs BP (!) 140/97 (BP Location: Left Arm)   Pulse 89   Temp 98.3 F (36.8 C) (Oral)   Resp 18   SpO2 95%   Physical Exam  Constitutional: She is oriented to person, place, and time. She appears well-developed and well-nourished.  HENT:  Head: Normocephalic and  atraumatic.  Eyes: Pupils are equal, round, and reactive to light. Conjunctivae are normal. Right eye exhibits no discharge. Left eye exhibits no discharge. No scleral icterus.  Neck: Normal range of motion. No JVD present. No tracheal deviation present.  Cardiovascular: Regular rhythm, normal heart sounds and intact distal pulses. Exam reveals no gallop and no friction rub.  No murmur heard. Pulmonary/Chest: Effort normal and breath sounds normal. No stridor. No respiratory distress. She has no wheezes. She has no rales. She exhibits no tenderness.  Neurological: She is alert and oriented to person, place, and time. Coordination normal.  Psychiatric: She has a normal mood and affect. Her behavior is normal. Judgment and thought content normal.  Nursing note and vitals reviewed.   ED Treatments / Results  Labs (all labs ordered are listed, but only abnormal results are displayed) Labs Reviewed  CBC WITH DIFFERENTIAL/PLATELET  BASIC METABOLIC PANEL  D-DIMER, QUANTITATIVE (NOT AT [facility])  I-STAT BETA HCG BLOOD, ED ([facility], [facility], [facility] ONLY)  I-STAT TROPONIN, ED    EKG None  ED ECG REPORT   Date: 09/11/2017  Rate: 139  Rhythm: normal sinus rhythm and sinus tachycardia  QRS Axis: normal  Intervals: normal  ST/T Wave abnormalities: normal  Conduction Disutrbances:none  Narrative Interpretation:   Old EKG Reviewed: none available  I have personally reviewed the EKG tracing and agree with the computerized printout as noted. Radiology Dg Chest 2 View  Result Date: 09/11/2017 CLINICAL DATA:  Chest pain EXAM: CHEST - 2 VIEW COMPARISON:  April 25, 2012 FINDINGS: There is no edema or consolidation. The heart size and pulmonary vascularity within normal limits. No adenopathy. No evident bone lesions. IMPRESSION: No edema or consolidation. Electronically Signed   By: Bretta Bang III M.D.   On: 09/11/2017 15:55    Procedures Procedures (including critical care time)  Medications Ordered  in ED Medications  aspirin chewable tablet 324 mg (324 mg Oral Given 09/11/17 1533)  hydrOXYzine (ATARAX/VISTARIL) tablet 50 mg (50 mg Oral Given 09/11/17 1534)     Initial Impression / Assessment and Plan / ED Course  I have reviewed the triage vital signs and the nursing notes.  Pertinent labs & imaging results that were available during my care of the patient were reviewed by me and considered in my medical decision making (see chart for details).     Labs: I-STAT troponin, CBC BMP, D dimer   Imaging: DG chest 2 view  Consults:  Therapeutics: Hydroxyzine  Discharge Meds: Hydroxyzine  Assessment/Plan: 32 year old female presents today with complaints of chest pain and shortness of breath.  Her presentation  is most consistent with anxiety.  She notes this is similar in nature.  She did have an episode of sharp chest pain which has dramatically improved.  Patient has a negative d-dimer, negative troponin, reassuring EKG, low heart score with very low suspicion for ACS, dissection, PE, arrhythmia, or any other life-threatening etiology.  Patient will be given strict return precautions, she verbalized understanding and agreement to today's plan had no further questions or concerns at the time of discharge.    Final Clinical Impressions(s) / ED Diagnoses   Final diagnoses:  Panic attack    ED Discharge Orders        Ordered    hydrOXYzine (ATARAX/VISTARIL) 25 MG tablet  Every 6 hours     09/11/17 1951       Rosalio Loud 09/11/17 1956    Linwood Dibbles, MD 09/12/17 1429

## 2017-09-11 NOTE — ED Notes (Signed)
Pt departed in NAD, refused use of wheelchair.  

## 2017-09-11 NOTE — Discharge Instructions (Addendum)
Please read attached information. If you experience any new or worsening signs or symptoms please return to the emergency room for evaluation. Please follow-up with your primary care provider or specialist as discussed. Please use medication prescribed only as directed and discontinue taking if you have any concerning signs or symptoms.   °

## 2017-09-11 NOTE — ED Provider Notes (Signed)
Patient placed in Quick Look pathway, seen and evaluated   Chief Complaint: CP x30 mins  HPI:   Pt is a 32 y.o. y/o female  with a PMHx of anemia, presenting today with c/o left-sided chest pain that began about 30 minutes ago while she was driving.  She states that she has been very stressed at school and thinks this could be contributing.  She wonders whether this could be an anxiety attack as she feels very anxious.  She also has associated left arm tingling.  She also has some palpitations but she states that her heart rate is always high, typically over 100.  She denies any shortness of breath, lightheadedness, diaphoresis, nausea, or vomiting.  She also denies any leg swelling.  She reports that recently she had a dry cough that she attributed to allergies.  ROS: +CP, +L arm paresthesias, +palpitations, +anxiety. NO SOB, diaphoresis, lightheadedness, LE swelling, n/v   Physical Exam:  BP (!) 140/97 (BP Location: Left Arm)   Pulse (!) 129   Temp 98.3 F (36.8 C) (Oral)   Resp 18   SpO2 100%    Gen: No distress although fairly anxious appearing  Neuro: Awake and Alert  Skin: Warm    Focused Exam: cardio: tachycardic in the 120s, reg rhythm, nl s1/s2, no m/r/g, distal pulses intact, no pedal edema. Pulm: CTAB with no w/r/r. Chest: moderate L sided chest wall TTP without crepitus/retractions   Initiation of care has begun. The patient has been counseled on the process, plan, and necessity for staying for the completion/evaluation, and the remainder of the medical screening examination     [id number] Courttreet, MerrickMercedes, [place]PA-C 09/11/17 1508    Abelino DerrickMackuen, Courteney Lyn, MD 09/17/17 [phone]

## 2017-10-30 DIAGNOSIS — Z0000 Encounter for general adult medical examination without abnormal findings: Secondary | ICD-10-CM | POA: Diagnosis not present

## 2017-11-06 DIAGNOSIS — Z0000 Encounter for general adult medical examination without abnormal findings: Secondary | ICD-10-CM | POA: Diagnosis not present

## 2017-11-06 DIAGNOSIS — Z136 Encounter for screening for cardiovascular disorders: Secondary | ICD-10-CM | POA: Diagnosis not present

## 2017-11-06 DIAGNOSIS — Z131 Encounter for screening for diabetes mellitus: Secondary | ICD-10-CM | POA: Diagnosis not present

## 2017-12-17 ENCOUNTER — Ambulatory Visit: Payer: BLUE CROSS/BLUE SHIELD | Admitting: Skilled Nursing Facility1

## 2018-03-09 DIAGNOSIS — Z111 Encounter for screening for respiratory tuberculosis: Secondary | ICD-10-CM | POA: Diagnosis not present

## 2018-04-05 DIAGNOSIS — J069 Acute upper respiratory infection, unspecified: Secondary | ICD-10-CM | POA: Diagnosis not present

## 2018-06-10 DIAGNOSIS — Z1389 Encounter for screening for other disorder: Secondary | ICD-10-CM | POA: Diagnosis not present

## 2018-06-10 DIAGNOSIS — Z01419 Encounter for gynecological examination (general) (routine) without abnormal findings: Secondary | ICD-10-CM | POA: Diagnosis not present

## 2018-06-10 DIAGNOSIS — Z130 Encounter for screening for diseases of the blood and blood-forming organs and certain disorders involving the immune mechanism: Secondary | ICD-10-CM | POA: Diagnosis not present

## 2018-06-10 DIAGNOSIS — Z6841 Body Mass Index (BMI) 40.0 and over, adult: Secondary | ICD-10-CM | POA: Diagnosis not present

## 2019-11-02 ENCOUNTER — Ambulatory Visit: Payer: BLUE CROSS/BLUE SHIELD | Admitting: Sports Medicine

## 2020-05-24 ENCOUNTER — Encounter: Payer: Self-pay | Admitting: Emergency Medicine

## 2020-05-24 ENCOUNTER — Other Ambulatory Visit: Payer: Self-pay

## 2020-05-24 ENCOUNTER — Ambulatory Visit
Admission: EM | Admit: 2020-05-24 | Discharge: 2020-05-24 | Disposition: A | Discharge status: Other Acute Inpatient Hospital | Payer: No Typology Code available for payment source | Attending: Internal Medicine | Admitting: Internal Medicine

## 2020-05-24 ENCOUNTER — Encounter (HOSPITAL_COMMUNITY): Payer: Self-pay | Admitting: Emergency Medicine

## 2020-05-24 DIAGNOSIS — R1011 Right upper quadrant pain: Secondary | ICD-10-CM

## 2020-05-24 DIAGNOSIS — Z20822 Contact with and (suspected) exposure to covid-19: Secondary | ICD-10-CM | POA: Diagnosis not present

## 2020-05-24 LAB — URINALYSIS, ROUTINE W REFLEX MICROSCOPIC
Bilirubin Urine: NEGATIVE
Glucose, UA: NEGATIVE mg/dL
Ketones, ur: 20 mg/dL — AB
Leukocytes,Ua: NEGATIVE
Nitrite: NEGATIVE
Protein, ur: NEGATIVE mg/dL
RBC / HPF: >50 RBC/hpf — ABNORMAL HIGH (ref 0–5)
Specific Gravity, Urine: 1.019 (ref 1.005–1.030)
pH: 6 (ref 5.0–8.0)

## 2020-05-24 LAB — POC URINE PREG, ED: Preg Test, Ur: NEGATIVE

## 2020-05-24 NOTE — ED Triage Notes (Signed)
Pt to ED for RUQ pain since last night. Pt has history of sludge in her gallbladder.  Pt has had N/V.

## 2020-05-24 NOTE — ED Provider Notes (Signed)
[facility]    CSN: [id number] Arrival date & time: 05/24/20  1648      History   Chief Complaint Chief Complaint  Patient presents with  . Abdominal Pain    HPI Leslie Hatfield is a 35 y.o. female comes to urgent care with right upper quadrant pain which started yesterday.  Pain is throbbing, persistent and radiates to the back.  Patient has a history of gallbladder sludge.  No fever or chills.  No nausea or vomiting.  Patient is avoiding oral intake.  No diarrhea.  No abdominal distention.  No constipation.   HPI  Past Medical History:  Diagnosis Date  . Anemia   . No pertinent past medical history     Patient Active Problem List   Diagnosis Date Noted  . S/P cesarean section 02/18/2015    Past Surgical History:  Procedure Laterality Date  . CESAREAN SECTION  12/04/2010   Procedure: CESAREAN SECTION;  Surgeon: Oliver Pila;  Location: [facility];  Service: Gynecology;  Laterality: N/A;  . CESAREAN SECTION    . CESAREAN SECTION N/A 02/18/2015   Procedure: CESAREAN SECTION;  Surgeon: Lavina Hamman, MD;  Location: [facility];  Service: Obstetrics;  Laterality: N/A;  . vaginal poloyps    . vaginal polyps removed      OB History    Gravida  4   Para  2   Term  2   Preterm      AB  2   Living  1     SAB  2   IAB      Ectopic      Multiple  0   Live Births  1            Home Medications    Prior to Admission medications   Medication Sig Start Date End Date Taking? Authorizing Provider  diphenhydramine-acetaminophen (TYLENOL PM) 25-500 MG TABS tablet Take 2 tablets by mouth at bedtime as needed (sleep,pain).    [provider]  hydrOXYzine (ATARAX/VISTARIL) 25 MG tablet Take 1 tablet (25 mg total) by mouth every 6 (six) hours. 09/11/17   Hedges, Tinnie Gens, PA-C  IUD'S IU 1 each by Intrauterine route continuous.    [provider]  lidocaine (XYLOCAINE) 2 % solution Use as directed 15 mLs in the mouth or throat as needed  for mouth pain. 06/30/17   Khatri, Hina, PA-C  phenol (CHLORASEPTIC) 1.4 % LIQD Use as directed 2 sprays in the mouth or throat as needed for throat irritation / pain.    [provider]    Family History Family History  Problem Relation Age of Onset  . Heart disease Maternal Grandmother   . Heart disease Maternal Grandfather   . Diabetes Paternal Grandfather   . Diabetes Paternal Grandmother     Social History Social History   Tobacco Use  . Smoking status: Never Smoker  . Smokeless tobacco: Never Used  Substance Use Topics  . Alcohol use: No  . Drug use: No     Allergies   Patient has no known allergies.   Review of Systems Review of Systems  Constitutional: Negative.   HENT: Negative.   Gastrointestinal: Positive for abdominal pain and nausea. Negative for diarrhea and vomiting.  Neurological: Negative.      Physical Exam Triage Vital Signs ED Triage Vitals  Enc Vitals Group     BP 05/24/20 1858 (!) 134/94     Pulse Rate 05/24/20 1858 94  Resp 05/24/20 1858 18     Temp 05/24/20 1858 97.8 F (36.6 C)     Temp Source 05/24/20 1858 Oral     SpO2 05/24/20 1858 97 %     Weight --      Height --      Head Circumference --      Peak Flow --      Pain Score 05/24/20 1906 8     Pain Loc --      Pain Edu? --      Excl. in GC? --    No data found.  Updated Vital Signs BP (!) 134/94   Pulse 94   Temp 97.8 F (36.6 C) (Oral)   Resp 18   SpO2 97%   Visual Acuity Right Eye Distance:   Left Eye Distance:   Bilateral Distance:    Right Eye Near:   Left Eye Near:    Bilateral Near:     Physical Exam Vitals and nursing note reviewed.  Constitutional:      General: She is in acute distress.     Appearance: She is ill-appearing.  Cardiovascular:     Rate and Rhythm: Normal rate and regular rhythm.  Pulmonary:     Effort: Pulmonary effort is normal.     Breath sounds: Normal breath sounds.  Abdominal:     General: Abdomen is protuberant.  Bowel sounds are normal.     Palpations: Abdomen is soft. There is no shifting dullness, hepatomegaly or splenomegaly.     Tenderness: There is abdominal tenderness in the right upper quadrant. There is no guarding or rebound. Positive signs include Murphy's sign. Negative signs include Rovsing's sign.     Hernia: No hernia is present.  Neurological:     Mental Status: She is alert.      UC Treatments / Results  Labs (all labs ordered are listed, but only abnormal results are displayed) Labs Reviewed - No data to display  EKG   Radiology No results found.  Procedures Procedures (including critical care time)  Medications Ordered in UC Medications - No data to display  Initial Impression / Assessment and Plan / UC Course  I have reviewed the triage vital signs and the nursing notes.  Pertinent labs & imaging results that were available during my care of the patient were reviewed by me and considered in my medical decision making (see chart for details).     1.  Right upper quadrant abdominal pain: Patient has ongoing right upper quadrant abdominal pain as well as a history of gallbladder disease She will need further evaluation including imaging to rule out cholecystitis Patient is advised to go to the emergency department for further evaluation. Patient agrees to go to the emergency department for further evaluation. Final Clinical Impressions(s) / UC Diagnoses   Final diagnoses:  RUQ abdominal pain     Discharge Instructions     You need further evaluation in the emergency department I suspect that you may be having gallbladder pain and you might need imaging study of your abdomen.   ED Prescriptions    None     PDMP not reviewed this encounter.   Merrilee Jansky, MD 05/24/20 [phone]

## 2020-05-24 NOTE — ED Triage Notes (Signed)
Pt here for RUQ pain starting last night with hx of gall bladder issues; unsure if is same today; pt sts some vomiting today also

## 2020-05-24 NOTE — Discharge Instructions (Signed)
You need further evaluation in the emergency department I suspect that you may be having gallbladder pain and you might need imaging study of your abdomen.

## 2020-05-25 ENCOUNTER — Emergency Department (HOSPITAL_COMMUNITY)
Admission: EM | Admit: 2020-05-25 | Discharge: 2020-05-25 | Disposition: A | Discharge status: Home / Self Care | Payer: No Typology Code available for payment source | Attending: Emergency Medicine | Admitting: Emergency Medicine

## 2020-05-25 ENCOUNTER — Emergency Department (HOSPITAL_COMMUNITY)
Admission: RE | Admit: 2020-05-25 | Discharge: 2020-05-25 | Disposition: A | Discharge status: Home / Self Care | Payer: No Typology Code available for payment source | Source: Ambulatory Visit | Attending: Emergency Medicine | Admitting: Emergency Medicine

## 2020-05-25 DIAGNOSIS — R1011 Right upper quadrant pain: Secondary | ICD-10-CM

## 2020-05-25 LAB — COMPREHENSIVE METABOLIC PANEL
ALT: 51 U/L — ABNORMAL HIGH (ref 0–44)
AST: 49 U/L — ABNORMAL HIGH (ref 15–41)
Albumin: 4.5 g/dL (ref 3.5–5.0)
Alkaline Phosphatase: 82 U/L (ref 38–126)
Anion gap: 15 (ref 5–15)
BUN: 13 mg/dL (ref 6–20)
CO2: 24 mmol/L (ref 22–32)
Calcium: 9.8 mg/dL (ref 8.9–10.3)
Chloride: 96 mmol/L — ABNORMAL LOW (ref 98–111)
Creatinine, Ser: 0.67 mg/dL (ref 0.44–1.00)
GFR, Estimated: >60 mL/min (ref >60)
Glucose, Bld: 108 mg/dL — ABNORMAL HIGH (ref 70–99)
Potassium: 4 mmol/L (ref 3.5–5.1)
Sodium: 135 mmol/L (ref 135–145)
Total Bilirubin: 1 mg/dL (ref 0.3–1.2)
Total Protein: 9 g/dL — ABNORMAL HIGH (ref 6.5–8.1)

## 2020-05-25 LAB — RESP PANEL BY RT-PCR (FLU A&B, COVID) ARPGX2
Influenza A by PCR: NEGATIVE
Influenza B by PCR: NEGATIVE
SARS Coronavirus 2 by RT PCR: NEGATIVE

## 2020-05-25 LAB — CBC
HCT: 46.9 % — ABNORMAL HIGH (ref 36.0–46.0)
Hemoglobin: 15.1 g/dL — ABNORMAL HIGH (ref 12.0–15.0)
MCH: 29.6 pg (ref 26.0–34.0)
MCHC: 32.2 g/dL (ref 30.0–36.0)
MCV: 92 fL (ref 80.0–100.0)
Platelets: 300 10*3/uL (ref 150–400)
RBC: 5.1 MIL/uL (ref 3.87–5.11)
RDW: 12.8 % (ref 11.5–15.5)
WBC: 6.6 10*3/uL (ref 4.0–10.5)
nRBC: 0 % (ref 0.0–0.2)

## 2020-05-25 LAB — LIPASE, BLOOD: Lipase: 26 U/L (ref 11–51)

## 2020-05-25 MED ORDER — SODIUM CHLORIDE 0.9 % IV BOLUS
500.0000 mL | Freq: Once | INTRAVENOUS | Status: AC
  Administered 2020-05-25: 500 mL via INTRAVENOUS

## 2020-05-25 MED ORDER — PANTOPRAZOLE SODIUM 40 MG PO TBEC: 40.0000 mg | DELAYED_RELEASE_TABLET | Freq: Every day | ORAL | 3 refills | Status: DC

## 2020-05-25 MED ORDER — ONDANSETRON HCL 4 MG/2ML IJ SOLN
4.0000 mg | Freq: Once | INTRAMUSCULAR | Status: AC
  Administered 2020-05-25: 4 mg via INTRAVENOUS
  Filled 2020-05-25: qty 2

## 2020-05-25 MED ORDER — HYDROMORPHONE HCL 1 MG/ML IJ SOLN
1.0000 mg | Freq: Once | INTRAMUSCULAR | Status: AC
Start: 2020-05-25 — End: 2020-05-25
  Administered 2020-05-25: 1 mg via INTRAVENOUS
  Filled 2020-05-25: qty 1

## 2020-05-25 NOTE — ED Provider Notes (Signed)
Patient updated regarding unremarkable ultrasound - no evidence of acute biliary disease at this time.  Seen from waiting room.   Terald Sleeper, MD 05/25/20 [phone]

## 2020-05-25 NOTE — ED Provider Notes (Signed)
[facility] EMERGENCY DEPARTMENT Provider Note   CSN: [id number] Arrival date & time: 05/24/20  2037     History Chief Complaint  Patient presents with  . Abdominal Pain    Leslie Hatfield is a 35 y.o. female.  Patient presents to the emergency department for evaluation of abdominal pain.  Symptoms began yesterday.  Patient reports sharp throbbing right upper quadrant pain that radiates around the right side to the back.  Not been eating or drinking much today.  No vomiting, diarrhea.  Patient reports a history of gallbladder problems a couple of years ago.  She reports that she was told she had gallbladder sludge but no surgery was recommended.        Past Medical History:  Diagnosis Date  . Anemia   . No pertinent past medical history     Patient Active Problem List   Diagnosis Date Noted  . S/P cesarean section 02/18/2015    Past Surgical History:  Procedure Laterality Date  . CESAREAN SECTION  12/04/2010   Procedure: CESAREAN SECTION;  Surgeon: Logan Bores;  Location: [place] [facility];  Service: Gynecology;  Laterality: N/A;  . CESAREAN SECTION    . CESAREAN SECTION N/A 02/18/2015   Procedure: CESAREAN SECTION;  Surgeon: Cheri Fowler, MD;  Location: [place] [facility];  Service: Obstetrics;  Laterality: N/A;  . vaginal poloyps    . vaginal polyps removed       OB History    Gravida  4   Para  2   Term  2   Preterm      AB  2   Living  1     SAB  2   IAB      Ectopic      Multiple  0   Live Births  1           Family History  Problem Relation Age of Onset  . Heart disease Maternal Grandmother   . Heart disease Maternal Grandfather   . Diabetes Paternal Grandfather   . Diabetes Paternal Grandmother     Social History   Tobacco Use  . Smoking status: Never Smoker  . Smokeless tobacco: Never Used  Substance Use Topics  . Alcohol use: No  . Drug use: No    Home Medications Prior to Admission medications   Medication Sig Start Date End Date  Taking? Authorizing Provider  pantoprazole (PROTONIX) 40 MG tablet Take 1 tablet (40 mg total) by mouth daily. 05/25/20  Yes Darryll Raju, Gwenyth Allegra, MD  diphenhydramine-acetaminophen (TYLENOL PM) 25-500 MG TABS tablet Take 2 tablets by mouth at bedtime as needed (sleep,pain).    [provider]  hydrOXYzine (ATARAX/VISTARIL) 25 MG tablet Take 1 tablet (25 mg total) by mouth every 6 (six) hours. 09/11/17   Hedges, Dellis Filbert, PA-C  IUD'S IU 1 each by Intrauterine route continuous.    [provider]  lidocaine (XYLOCAINE) 2 % solution Use as directed 15 mLs in the mouth or throat as needed for mouth pain. 06/30/17   Khatri, Hina, PA-C  phenol (CHLORASEPTIC) 1.4 % LIQD Use as directed 2 sprays in the mouth or throat as needed for throat irritation / pain.    [provider]    Allergies    Patient has no known allergies.  Review of Systems   Review of Systems  Gastrointestinal: Positive for abdominal pain.  All other systems reviewed and are negative.   Physical Exam Updated Vital Signs BP 108/60 (BP Location: Right Arm)  Pulse 82   Temp 98 F (36.7 C) (Oral)   Resp 16   Ht 5\' 4"  (1.626 m)   Wt 113.4 kg   SpO2 96%   BMI 42.91 kg/m   Physical Exam Vitals and nursing note reviewed.  Constitutional:      General: She is not in acute distress.    Appearance: Normal appearance. She is well-developed and well-nourished.  HENT:     Head: Normocephalic and atraumatic.     Right Ear: Hearing normal.     Left Ear: Hearing normal.     Nose: Nose normal.     Mouth/Throat:     Mouth: Oropharynx is clear and moist and mucous membranes are normal.  Eyes:     Extraocular Movements: EOM normal.     Conjunctiva/sclera: Conjunctivae normal.     Pupils: Pupils are equal, round, and reactive to light.  Cardiovascular:     Rate and Rhythm: Regular rhythm.     Heart sounds: S1 normal and S2 normal. No murmur heard. No friction rub. No gallop.   Pulmonary:     Effort:  Pulmonary effort is normal. No respiratory distress.     Breath sounds: Normal breath sounds.  Chest:     Chest wall: No tenderness.  Abdominal:     General: Bowel sounds are normal.     Palpations: Abdomen is soft. There is no hepatosplenomegaly.     Tenderness: There is abdominal tenderness in the right upper quadrant. There is no guarding or rebound. Negative signs include Murphy's sign and McBurney's sign.     Hernia: No hernia is present.  Musculoskeletal:        General: Normal range of motion.     Cervical back: Normal range of motion and neck supple.  Skin:    General: Skin is warm, dry and intact.     Findings: No rash.     Nails: There is no cyanosis.  Neurological:     Mental Status: She is alert and oriented to person, place, and time.     GCS: GCS eye subscore is 4. GCS verbal subscore is 5. GCS motor subscore is 6.     Cranial Nerves: No cranial nerve deficit.     Sensory: No sensory deficit.     Coordination: Coordination normal.     Deep Tendon Reflexes: Strength normal.  Psychiatric:        Mood and Affect: Mood and affect normal.        Speech: Speech normal.        Behavior: Behavior normal.        Thought Content: Thought content normal.     ED Results / Procedures / Treatments   Labs (all labs ordered are listed, but only abnormal results are displayed) Labs Reviewed  COMPREHENSIVE METABOLIC PANEL - Abnormal; Notable for the following components:      Result Value   Chloride 96 (*)    Glucose, Bld 108 (*)    Total Protein 9.0 (*)    AST 49 (*)    ALT 51 (*)    All other components within normal limits  CBC - Abnormal; Notable for the following components:   Hemoglobin 15.1 (*)    HCT 46.9 (*)    All other components within normal limits  URINALYSIS, ROUTINE W REFLEX MICROSCOPIC - Abnormal; Notable for the following components:   APPearance HAZY (*)    Hgb urine dipstick LARGE (*)    Ketones, ur 20 (*)  RBC / HPF >50 (*)    Bacteria, UA RARE  (*)    All other components within normal limits  RESP PANEL BY RT-PCR (FLU A&B, COVID) ARPGX2  LIPASE, BLOOD  POC URINE PREG, ED    EKG None  Radiology No results found.  Procedures Procedures (including critical care time)  Medications Ordered in ED Medications  sodium chloride 0.9 % bolus 500 mL (500 mLs Intravenous New Bag/Given 05/25/20 0130)  ondansetron (ZOFRAN) injection 4 mg (4 mg Intravenous Given 05/25/20 0131)  HYDROmorphone (DILAUDID) injection 1 mg (1 mg Intravenous Given 05/25/20 0131)    ED Course  I have reviewed the triage vital signs and the nursing notes.  Pertinent labs & imaging results that were available during my care of the patient were reviewed by me and considered in my medical decision making (see chart for details).    MDM Rules/Calculators/A&P                          Patient presents to the emergency department for evaluation of abdominal pain.  Patient experiencing sharp pain in the right upper abdomen that radiates into her back.  She has associated nausea, no hematemesis, melena or rectal bleeding.  Patient with a history of gallbladder sludge, seen years ago on work-up for similar pain.  No cholecystectomy recommended at that time.  Patient does have essentially normal labs.  No fever.  Abdominal exam does not reveal any signs of peritonitis.  No Murphy sign.  At this point would prefer not to perform CT scan, as I feel it would be of low yield study.  She would, however, benefit from ultrasound of gallbladder.  This is not available at night.  Patient feeling much better after analgesia.  She will therefore be discharged, return tomorrow for outpatient ultrasound to further evaluate.  Will place on Protonix empirically.  Patient requesting Covid swab for her job.  Will perform, does not need to wait for results.  Final Clinical Impression(s) / ED Diagnoses Final diagnoses:  Right upper quadrant abdominal pain    Rx / DC Orders ED Discharge  Orders         Ordered    pantoprazole (PROTONIX) 40 MG tablet  Daily        05/25/20 0239    US Abdomen Limited RUQ/Gall Gladder        05/25/20 0240           Gilda Crease, MD 05/25/20 [phone]

## 2021-01-05 ENCOUNTER — Other Ambulatory Visit (HOSPITAL_COMMUNITY): Payer: Self-pay

## 2021-01-05 MED ORDER — CYCLOBENZAPRINE HCL 10 MG PO TABS
10.0000 mg | ORAL_TABLET | Freq: Every day | ORAL | 0 refills | Status: DC
  Filled 2021-01-05: qty 20, 20d supply, fill #0

## 2021-01-09 ENCOUNTER — Other Ambulatory Visit: Payer: Self-pay | Admitting: Family Medicine

## 2021-01-09 ENCOUNTER — Ambulatory Visit: Payer: Self-pay

## 2021-01-09 ENCOUNTER — Other Ambulatory Visit: Payer: Self-pay

## 2021-01-09 DIAGNOSIS — M542 Cervicalgia: Secondary | ICD-10-CM

## 2021-01-23 ENCOUNTER — Ambulatory Visit: Payer: PRIVATE HEALTH INSURANCE

## 2021-01-30 ENCOUNTER — Ambulatory Visit: Payer: PRIVATE HEALTH INSURANCE | Attending: Family Medicine

## 2021-01-30 ENCOUNTER — Other Ambulatory Visit: Payer: Self-pay

## 2021-01-30 DIAGNOSIS — R293 Abnormal posture: Secondary | ICD-10-CM | POA: Diagnosis present

## 2021-01-30 DIAGNOSIS — M62838 Other muscle spasm: Secondary | ICD-10-CM | POA: Insufficient documentation

## 2021-01-30 DIAGNOSIS — M542 Cervicalgia: Secondary | ICD-10-CM | POA: Diagnosis not present

## 2021-01-31 NOTE — Therapy (Signed)
[facility] [address] [place], [place], [address] Phone: [phone]   Fax:  [phone]  Physical Therapy Evaluation  Patient Details  Name: Chieko Neises MRN: [id number] Date of Birth: Feb 11, 1986 Referring Provider (PT): Lanell Persons, MD   Encounter Date: 01/30/2021   PT End of Session - 01/31/21 0955     Visit Number 1    Number of Visits 13    Date for PT Re-Evaluation 03/24/21    Authorization Type [employer]    Progress Note Due on Visit 10    PT Start Time 1334    PT Stop Time 1424    PT Time Calculation (min) 50 min    Activity Tolerance Patient tolerated treatment well;Other (comment)   minimal increase with symptoms with cervcial retraction ex   Behavior During Therapy [facility] for tasks assessed/performed             Past Medical History:  Diagnosis Date   Anemia    No pertinent past medical history     Past Surgical History:  Procedure Laterality Date   CESAREAN SECTION  12/04/2010   Procedure: CESAREAN SECTION;  Surgeon: Oliver Pila;  Location: [facility];  Service: Gynecology;  Laterality: N/A;   CESAREAN SECTION     CESAREAN SECTION N/A 02/18/2015   Procedure: CESAREAN SECTION;  Surgeon: Lavina Hamman, MD;  Location: [facility];  Service: Obstetrics;  Laterality: N/A;   vaginal poloyps     vaginal polyps removed      There were no vitals filed for this visit.    Subjective Assessment - 01/30/21 1340     Subjective On approx 01/02/21, pt reports injuring her L neck when she was on her hands and knees under a desk reaching to pull out an electrical cord with her L hand. pt notes she experienced L lower neck and upper shoulder pain. Pt states she is improving and has been completing the exs she received from Occupational Health which have been helpful. Certain neck and L arm movements bother her intermittently, approx 2/10, but otherwise she is not in pain. Did have an indicent on 9/11 when  helping a pt up in bed up with another employee and experienced a brief incident of pain. Addtionally, pt notes sometimes the pain runs down her medial arm and she has tingling in the palm of her L hand. 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    Limitations Lifting    How long can you sit comfortably? Not an issue    How long can you stand comfortably? Not an issue    How long can you walk comfortably? Not an issue    Diagnostic tests 01/09/21 X rays:IMPRESSION:  1. No acute findings.  2. Straightening of the cervical lordosis, which may be positional  or secondary to muscular strain.    Patient Stated Goals For the pain to improve and to be able to use my neck and L UE without    Currently in Pain? Yes    Pain Score 0-No pain   2/10   Pain Location Neck    Pain Orientation Left;Lateral    Pain Descriptors / Indicators Aching;Tingling    Pain Type Acute pain    Pain Radiating Towards L arm with tingling in the L palm    Pain Onset 1 to 4 weeks ago    Pain Frequency Intermittent    Aggravating Factors  Certain neck and arm movements  Pain Relieving Factors Holding L hand above her head, rest                [facility] PT Assessment - 01/31/21 0001       Assessment   Medical Diagnosis Cervicalgia    Referring Provider (PT) Lanell Persons, MD    Onset Date/Surgical Date --   approx. 01/02/21   Hand Dominance Right      Precautions   Precautions None      Restrictions   Weight Bearing Restrictions No      Balance Screen   Has the patient fallen in the past 6 months No    Has the patient had a decrease in activity level because of a fear of falling?  No    Is the patient reluctant to leave their home because of a fear of falling?  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    Leisure Walk, activites with kids      Cognition   Overall Cognitive Status Within Functional Limits for tasks assessed      Observation/Other Assessments   Focus on Therapeutic Outcomes (FOTO)  66% functional ability      Sensation   Light Touch Appears Intact      Posture/Postural Control   Posture/Postural Control Postural limitations    Postural Limitations Forward head;Rounded Shoulders      ROM / Strength   AROM / PROM / Strength AROM;Strength      AROM   AROM Assessment Site Cervical    Cervical Flexion 45    Cervical Extension 59    Cervical - Right Side Bend 40    Cervical - Left Side Bend 40    Cervical - Right Rotation 70    Cervical - Left Rotation 70      Strength   Overall Strength Comments UE myotomal screen negative      Palpation   Palpation comment TTP L upper trap with increase muscle tensio in comparison to the R      Special Tests    Special Tests Cervical    Cervical Tests Spurling's      Spurling's   Findings Negative    Side Left   Right     Transfers   Transfers Sit to Stand;Stand to Sit    Sit to Stand 7: Independent      Ambulation/Gait   Gait Pattern Within Functional Limits;Step-through pattern                        Objective measurements completed on examination: See above findings.       [facility] Adult PT Treatment/Exercise - 01/31/21 0001       Exercises   Exercises Neck      Neck Exercises: Stretches   Upper Trapezius Stretch Right;Left;2 reps    Upper Trapezius Stretch Limitations 15 sec    Other Neck Stretches Cervical rotation, L and R, 15 sec  PT Education - 01/31/21 0948     Education Details Eval findings, POC, HEP, sleeping positions for comfort, use of  theracane for trigger point massage    Person(s) Educated Patient    Methods Explanation;Demonstration;Tactile cues;Handout;Verbal cues    Comprehension Verbalized understanding;Returned demonstration;Verbal cues required;Tactile cues required              PT Short Term Goals - 01/31/21 1036       PT SHORT TERM GOAL #1   Title Pt will be Ind in an initial HEP    Status New    Target Date 02/21/21      PT SHORT TERM GOAL #2   Title Pt will voice understanding of measures to assit in the reduction of pain    Status New    Target Date 02/21/21               PT Long Term Goals - 01/31/21 1037       PT LONG TERM GOAL #1   Title Pt will be Ind in a final HEP    Status New    Target Date 03/24/21      PT LONG TERM GOAL #2   Title Pt will report a reduction of L cervical and arm to 1/10 occuring on an infrequent basis    Baseline 2/10 occasionally    Status New    Target Date 03/24/21      PT LONG TERM GOAL #3   Title Pt will be able to lift 30lbs 10x from knee level to demonstrate the ability to return to work meeting it's physical demands    Status New    Target Date 03/24/21      PT LONG TERM GOAL #4   Title Pt's FOTO score will meet the predicted value of 71%    Baseline 66%    Status New    Target Date 03/24/21      PT LONG TERM GOAL #5   Title Pt will be able to demonstrate proper sitting posture to assit in the reduction of cervical strain/pain    Status New    Target Date 03/24/21                    Plan - 01/31/21 1011     Clinical Impression Statement Pt presents to PT following a neck at work. Pt reports she is improving and has returned to work light duty. Pt is primarily symptom free, but has have intermittent L cervical pain with occasional L arm and hand pain and tingling with certain neck and arm movements and with sleeping. With today's assessment, pt symptoms were provoked with cervical retraction exs at a min level 1/10. Pt was  able to tolerate cervical AROMs and demonstrated appropriate ROMs. A course of care was initiated-See education. Pt's cervical injury appears to be improving, but still is acute in nature with it being aggrevated by the cervical retraction exercise with nerve root irritation occuring. PT will benefit from PT 2w6 to address posture and  increase L cervical/upper trap muscle tension to decrease pain and optimize cervical function    Personal Factors and Comorbidities Fitness    Examination-Activity Limitations Lift    Examination-Participation Restrictions Occupation    Stability/Clinical Decision Making Stable/Uncomplicated    Clinical Decision Making Low    Rehab Potential Good    PT Frequency 2x / week    PT Duration 6 weeks    PT Treatment/Interventions ADLs/Self Care Home Management;Cryotherapy;Lobbyist  Stimulation;Ultrasound;Traction;Moist Heat;Iontophoresis 4mg /ml Dexamethasone;Therapeutic exercise;Manual techniques;Patient/family education;Taping;Spinal Manipulations;Passive range of motion;Dry needling    PT Next Visit Plan Assess reponse to HEP. Progress ther ex, use modalities and manual techniques as indicated    PT Home Exercise Plan [email or web address]    Consulted and Agree with Plan of Care Patient             Patient will benefit from skilled therapeutic intervention in order to improve the following deficits and impairments:  Obesity, Impaired UE functional use, Pain, Postural dysfunction, Impaired tone  Visit Diagnosis: Cervicalgia  Other muscle spasm  Abnormal posture     Problem List Patient Active Problem List   Diagnosis Date Noted   S/P cesarean section 02/18/2015    04/20/2015, PT 01/31/2021, 10:46 AM  [facility] [address] [place], [place], [place] Phone: [phone]   Fax:  [phone]  Name: Cythnia Osmun MRN: Franchot Gallo Date of Birth: 02/12/1986

## 2021-02-06 ENCOUNTER — Ambulatory Visit: Payer: PRIVATE HEALTH INSURANCE

## 2021-02-08 ENCOUNTER — Ambulatory Visit: Payer: PRIVATE HEALTH INSURANCE

## 2021-02-13 ENCOUNTER — Ambulatory Visit: Payer: PRIVATE HEALTH INSURANCE

## 2021-02-15 ENCOUNTER — Ambulatory Visit: Payer: PRIVATE HEALTH INSURANCE

## 2021-02-16 ENCOUNTER — Ambulatory Visit: Payer: PRIVATE HEALTH INSURANCE | Attending: Family Medicine | Admitting: Physical Therapy

## 2021-02-16 ENCOUNTER — Other Ambulatory Visit: Payer: Self-pay

## 2021-02-16 DIAGNOSIS — R293 Abnormal posture: Secondary | ICD-10-CM | POA: Diagnosis present

## 2021-02-16 DIAGNOSIS — M62838 Other muscle spasm: Secondary | ICD-10-CM | POA: Diagnosis present

## 2021-02-16 DIAGNOSIS — M542 Cervicalgia: Secondary | ICD-10-CM | POA: Diagnosis not present

## 2021-02-16 NOTE — Patient Instructions (Addendum)
Posture Tips DO: - stand tall and erect - keep chin tucked in - keep head and shoulders in alignment - check posture regularly in mirror or large window - pull head back against headrest in car seat;  Change your position often.  Sit with lumbar support. DON'T: - slouch or slump while watching TV or reading - sit, stand or lie in one position  for too long;  Sitting is especially hard on the spine so if you sit at a desk/use the computer, then stand up often!   Copyright  VHI. All rights reserved.  Posture - Standing   Good posture is important. Avoid slouching and forward head thrust. Maintain curve in low back and align ears over shoul- ders, hips over ankles.  Pull your belly button in toward your back bone.   Copyright  VHI. All rights reserved.  Posture - Sitting   Sit upright, head facing forward. Try using a roll to support lower back. Keep shoulders relaxed, and avoid rounded back. Keep hips level with knees. Avoid crossing legs for long periods.   Copyright  VHI. All rights reserved.    Sleeping on Back  Place pillow under knees. A pillow with cervical support and a roll around waist are also helpful. Copyright  VHI. All rights reserved.  Sleeping on Side Place pillow between knees. Use cervical support under neck and a roll around waist as needed. Copyright  VHI. All rights reserved.   Sleeping on Stomach   If this is the only desirable sleeping position, place pillow under lower legs, and under stomach or chest as needed.  Posture - Sitting   Sit upright, head facing forward. Try using a roll to support lower back. Keep shoulders relaxed, and avoid rounded back. Keep hips level with knees. Avoid crossing legs for long periods. Stand to Sit / Sit to Stand   To sit: Bend knees to lower self onto front edge of chair, then scoot back on seat. To stand: Reverse sequence by placing one foot forward, and scoot to front of seat. Use rocking motion to  stand up.   Work Height and Reach  Ideal work height is no more than 2 to 4 inches below elbow level when standing, and at elbow level when sitting. Reaching should be limited to arm's length, with elbows slightly bent.  Bending  Bend at hips and knees, not back. Keep feet shoulder-width apart.    Posture - Standing   Good posture is important. Avoid slouching and forward head thrust. Maintain curve in low back and align ears over shoul- ders, hips over ankles.  Alternating Positions   Alternate tasks and change positions frequently to reduce fatigue and muscle tension. Take rest breaks. Computer Work   Position work to Art gallery manager. Use proper work and seat height. Keep shoulders back and down, wrists straight, and elbows at right angles. Use chair that provides full back support. Add footrest and lumbar roll as needed.  Getting Into / Out of Car  Lower self onto seat, scoot back, then bring in one leg at a time. Reverse sequence to get out.  Dressing  Lie on back to pull socks or slacks over feet, or sit and bend leg while keeping back straight.    Housework - Sink  Place one foot on ledge of cabinet under sink when standing at sink for prolonged periods.   Pushing / Pulling  Pushing is preferable to pulling. Keep back in proper  alignment, and use leg muscles to do the work.  Deep Squat   Squat and lift with both arms held against upper trunk. Tighten stomach muscles without holding breath. Use smooth movements to avoid jerking.  Avoid Twisting   Avoid twisting or bending back. Pivot around using foot movements, and bend at knees if needed when reaching for articles.  Carrying Luggage   Distribute weight evenly on both sides. Use a cart whenever possible. Do not twist trunk. Move body as a unit.   Lifting Principles Maintain proper posture and head alignment. Slide object as close as possible before lifting. Move obstacles out of the way. Test before  lifting; ask for help if too heavy. Tighten stomach muscles without holding breath. Use smooth movements; do not jerk. Use legs to do the work, and pivot with feet. Distribute the work load symmetrically and close to the center of trunk. Push instead of pull whenever possible.   Ask For Help   Ask for help and delegate to others when possible. Coordinate your movements when lifting together, and maintain the low back curve.  Log Roll   Lying on back, bend left knee and place left arm across chest. Roll all in one movement to the right. Reverse to roll to the left. Always move as one unit. Housework - Sweeping  Use long-handled equipment to avoid stooping.   Housework - Wiping  Position yourself as close as possible to reach work surface. Avoid straining your back.  Laundry - Unloading Wash   To unload small items at bottom of washer, lift leg opposite to arm being used to reach.  Gardening - Raking  Move close to area to be raked. Use arm movements to do the work. Keep back straight and avoid twisting.     Cart  When reaching into cart with one arm, lift opposite leg to keep back straight.   Getting Into / Out of Bed  Lower self to lie down on one side by raising legs and lowering head at the same time. Use arms to assist moving without twisting. Bend both knees to roll onto back if desired. To sit up, start from lying on side, and use same move-ments in reverse. Housework - Vacuuming  Hold the vacuum with arm held at side. Step back and forth to move it, keeping head up. Avoid twisting.   Laundry - Armed forces training and education officer so that bending and twisting can be avoided.   Laundry - Unloading Dryer  Squat down to reach into clothes dryer or use a reacher.  Gardening - Weeding / Psychiatric nurse or Kneel. Knee pads may be helpful.                   Garen Lah, PT, ATRIC Certified Exercise Expert for the Aging Adult  02/16/21  10:04 AM Phone: [phone] Fax: [phone]  .law

## 2021-02-16 NOTE — Therapy (Signed)
[facility] [address] [place], [place], [address] Phone: [phone]   Fax:  [phone]  Physical Therapy Treatment  Patient Details  Name: Leslie Hatfield MRN: [id number] Date of Birth: 08-22-1985 Referring Provider (PT): Lanell Persons, MD   Encounter Date: 02/16/2021   PT End of Session - 02/16/21 0939     Visit Number 2    Number of Visits 13    Date for PT Re-Evaluation 03/24/21    Authorization Type [employer]    PT Start Time 0935    PT Stop Time 1015    PT Time Calculation (min) 40 min    Activity Tolerance Patient tolerated treatment well;Other (comment)    Behavior During Therapy [facility] for tasks assessed/performed             Past Medical History:  Diagnosis Date   Anemia    No pertinent past medical history     Past Surgical History:  Procedure Laterality Date   CESAREAN SECTION  12/04/2010   Procedure: CESAREAN SECTION;  Surgeon: Oliver Pila;  Location: [facility];  Service: Gynecology;  Laterality: N/A;   CESAREAN SECTION     CESAREAN SECTION N/A 02/18/2015   Procedure: CESAREAN SECTION;  Surgeon: Lavina Hamman, MD;  Location: [facility];  Service: Obstetrics;  Laterality: N/A;   vaginal poloyps     vaginal polyps removed      There were no vitals filed for this visit.   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  Pt would like to stregthen and wants to continue to pick up her 33 year old child without pain    Limitations Lifting    Diagnostic tests 01/09/21 X rays:IMPRESSION:  1. No acute findings.  2. Straightening of the cervical lordosis, which may be positional  or secondary to muscular strain.    Patient Stated Goals For the pain to improve and to be able to use my neck and L UE without    Currently in Pain? No/denies    Pain Score 0-No pain    Pain Location Neck                [facility] PT  Assessment - 02/16/21 0001       Assessment   Medical Diagnosis Cervicalgia    Referring Provider (PT) Lanell Persons, MD                           [facility] Adult PT Treatment/Exercise - 02/16/21 0001       Self-Care   Self-Care ADL's;Lifting;Posture;Other Self-Care Comments    ADL's using handout for examples    Lifting went over basic principles    Posture sitting , standing and    Other Self-Care Comments  discussed community wellness opportunities      Exercises   Exercises Neck      Neck Exercises: Standing   Other Standing Exercises standing shld ext bil with GTB 3 x 10,  bil ER with GTB 3 x 10, bil shld row with GTB 3 x 10      Neck Exercises: Seated   Other Seated Exercise sitting thoracic upper extension. 3 x 10    Other Seated Exercise resisted with GTB cervical extension. 3 x 10      Neck Exercises: Stretches   Upper Trapezius Stretch Right;Left;2 reps;30 seconds  Other Neck Stretches Cervical rotation, L and R, 15 sec                     PT Education - 02/16/21 1004     Education Details Pt progressed HEP with strength and handout with ADL, posture and body mechanics    Person(s) Educated Patient    Methods Explanation;Demonstration;Tactile cues;Verbal cues;Handout    Comprehension Verbalized understanding;Returned demonstration              PT Short Term Goals - 02/16/21 1000       PT SHORT TERM GOAL #1   Title Pt will be Ind in an initial HEP    Status On-going      PT SHORT TERM GOAL #2   Title Pt will voice understanding of measures to assit in the reduction of pain    Status On-going               PT Long Term Goals - 01/31/21 1037       PT LONG TERM GOAL #1   Title Pt will be Ind in a final HEP    Status New    Target Date 03/24/21      PT LONG TERM GOAL #2   Title Pt will report a reduction of L cervical and arm to 1/10 occuring on an infrequent basis    Baseline 2/10 occasionally    Status New     Target Date 03/24/21      PT LONG TERM GOAL #3   Title Pt will be able to lift 30lbs 10x from knee level to demonstrate the ability to return to work meeting it's physical demands    Status New    Target Date 03/24/21      PT LONG TERM GOAL #4   Title Pt's FOTO score will meet the predicted value of 71%    Baseline 66%    Status New    Target Date 03/24/21      PT LONG TERM GOAL #5   Title Pt will be able to demonstrate proper sitting posture to assit in the reduction of cervical strain/pain    Status New    Target Date 03/24/21                   Plan - 02/16/21 0941     Clinical Impression Statement Pt reports no pain today in neck and no reported tingling or radicular symptoms.  Pt was able to participate with strengthening exercise today and had no pain at end of session.  Pt educated on lifting, adl. posture and community wellness as well as pain managment strategies.  Pt will continue to strengthen and build reserve for injury prevention    Personal Factors and Comorbidities Fitness    Examination-Activity Limitations Lift    Examination-Participation Restrictions Occupation    PT Frequency 2x / week    PT Duration 6 weeks    PT Treatment/Interventions ADLs/Self Care Home Management;Cryotherapy;Electrical Stimulation;Ultrasound;Traction;Moist Heat;Iontophoresis 4mg /ml Dexamethasone;Therapeutic exercise;Manual techniques;Patient/family education;Taping;Spinal Manipulations;Passive range of motion;Dry needling    PT Next Visit Plan Assess reponse to HEP. Progress ther ex, use modalities and manual techniques as indicated  sleep hygiene    PT Home Exercise Plan [email or web address]    Consulted and Agree with Plan of Care Patient             Patient will benefit from skilled therapeutic intervention in order to improve the following deficits and impairments:  Obesity, Impaired  UE functional use, Pain, Postural dysfunction, Impaired tone  Visit  Diagnosis: Cervicalgia  Other muscle spasm  Abnormal posture  Access Code: [email or web address]: [email or web address]: 09/30/2022Prepared by: Wayland Denis BeardsleyExercises   Cervical Retraction with Resistance - 1 x daily - 7 x weekly - 3 sets - 10 reps - 5 hold  Seated Thoracic Extension at Wall - 1-2 x daily - 7 x weekly - 2 sets - 10 reps  Shoulder Extension with Resistance - Palms Forward - 1 x daily - 7 x weekly - 3 sets - 10 reps  Standing Shoulder External Rotation with Resistance - 1 x daily - 7 x weekly - 3 sets - 10 reps  Standing Bilateral Low Shoulder Row with Anchored Resistance - 1 x daily - 7 x weekly - 3 sets - 10 reps   Problem List Patient Active Problem List   Diagnosis Date Noted   S/P cesarean section 02/18/2015   Garen Lah, PT, ATRIC Certified Exercise Expert for the Aging Adult  02/16/21 10:22 AM Phone: [phone] Fax: [phone]   [facility] [address] [place], [place], [address] Phone: [phone]   Fax:  [phone]  Name: Leslie Hatfield MRN: [id number] Date of Birth: 07-10-1985

## 2021-02-21 ENCOUNTER — Telehealth: Payer: Self-pay

## 2021-02-21 ENCOUNTER — Ambulatory Visit: Payer: PRIVATE HEALTH INSURANCE | Attending: Family Medicine

## 2021-02-21 DIAGNOSIS — R293 Abnormal posture: Secondary | ICD-10-CM | POA: Insufficient documentation

## 2021-02-21 DIAGNOSIS — M542 Cervicalgia: Secondary | ICD-10-CM | POA: Insufficient documentation

## 2021-02-21 DIAGNOSIS — M62838 Other muscle spasm: Secondary | ICD-10-CM | POA: Insufficient documentation

## 2021-02-21 NOTE — Telephone Encounter (Signed)
Spoke with pt. Pt reports she thought she had rescheduled today's appt. Reminded pt of her next appt on 10/7. Pt reported she will be able to make that appt.

## 2021-02-23 ENCOUNTER — Ambulatory Visit: Payer: PRIVATE HEALTH INSURANCE

## 2021-02-27 ENCOUNTER — Ambulatory Visit: Payer: PRIVATE HEALTH INSURANCE | Admitting: Physical Therapy

## 2021-03-02 ENCOUNTER — Ambulatory Visit: Payer: PRIVATE HEALTH INSURANCE

## 2021-03-02 ENCOUNTER — Other Ambulatory Visit: Payer: Self-pay

## 2021-03-02 DIAGNOSIS — M62838 Other muscle spasm: Secondary | ICD-10-CM

## 2021-03-02 DIAGNOSIS — R293 Abnormal posture: Secondary | ICD-10-CM

## 2021-03-02 DIAGNOSIS — M542 Cervicalgia: Secondary | ICD-10-CM | POA: Diagnosis not present

## 2021-03-03 NOTE — Therapy (Signed)
[place], [place], [address] Phone: [phone]   Fax:  [phone]  Physical Therapy Treatment/Discharge  Patient Details  Name: Leslie Hatfield MRN: [id number] Date of Birth: Nov 02, 1985 Referring Provider (PT): Odis Luster, MD   Encounter Date: 03/02/2021   PT End of Session - 03/02/21 0847     Visit Number 3    Number of Visits 13    Date for PT Re-Evaluation 03/24/21    Authorization Type [employer]    Progress Note Due on Visit 10    PT Start Time 0847    PT Stop Time 0929    PT Time Calculation (min) 42 min    Activity Tolerance Patient tolerated treatment well;Other (comment)    Behavior During Therapy [facility] for tasks assessed/performed             Past Medical History:  Diagnosis Date   Anemia    No pertinent past medical history     Past Surgical History:  Procedure Laterality Date   CESAREAN SECTION  12/04/2010   Procedure: CESAREAN SECTION;  Surgeon: Logan Bores;  Location: [facility];  Service: Gynecology;  Laterality: N/A;   CESAREAN SECTION     CESAREAN SECTION N/A 02/18/2015   Procedure: CESAREAN SECTION;  Surgeon: Cheri Fowler, MD;  Location: [facility];  Service: Obstetrics;  Laterality: N/A;   vaginal poloyps     vaginal polyps removed      There were no vitals filed for this visit.   Subjective Assessment - 03/02/21 0852     Subjective Pt reports she is doing well and is not experiencing any neck or L arm pain or numbness. Pt reports she has been released back to full duty by Dr. Maylon Peppers.    Limitations Lifting    Diagnostic tests 01/09/21 X rays:IMPRESSION:  1. No acute findings.  2. Straightening of the cervical lordosis, which may be positional  or secondary to muscular strain.    Patient Stated Goals For the pain to improve and to be able to use my neck and L UE without    Currently in Pain? No/denies    Pain Score 0-No pain    Pain Location Neck     Pain Orientation Left;Lateral                               [facility] Adult PT Treatment/Exercise - 03/03/21 0001       Exercises   Exercises Neck      Neck Exercises: Standing   Other Standing Exercises standing shld ext bil with GTB 15,  bil ER with GTB 15x, bil shld row with GTB 15x    Other Standing Exercises Hinged hip lifting from knee to waist height, 30lbs, 10x      Neck Exercises: Seated   Other Seated Exercise sitting thoracic upper extension. 15x    Other Seated Exercise resisted with GTB cervical extension. 15x      Neck Exercises: Stretches   Upper Trapezius Stretch Right;Left;2 reps;30 seconds    Other Neck Stretches Cervical rotation, L and R, 15 sec                     PT Education - 03/03/21 0836     Education Details reviewed and pt completed her HEP. Instructed in and pt completed hinged hip lifting    Person(s) Educated Patient    Methods Explanation;Demonstration;Tactile cues;Verbal  cues    Comprehension Returned demonstration;Verbal cues required;Verbalized understanding;Tactile cues required              PT Short Term Goals - 03/03/21 0837       PT SHORT TERM GOAL #1   Title Pt will be Ind in an initial HEP    Status Achieved    Target Date 03/02/21      PT SHORT TERM GOAL #2   Title Pt will voice understanding of measures to assit in the reduction of pain    Status Achieved    Target Date 03/03/21               PT Long Term Goals - 03/02/21 0902       PT LONG TERM GOAL #1   Title Pt will be Ind in a final HEP    Status Achieved    Target Date 03/02/21      PT LONG TERM GOAL #2   Title Pt will report a reduction of L cervical and arm to 1/10 occuring on an infrequent basis. 10/14: 0/10    Status Achieved    Target Date 03/02/21      PT LONG TERM GOAL #3   Title Pt will be able to lift 30lbs 10x from knee level to demonstrate the ability to return to work meeting it's physical demands    Status  Achieved    Target Date 03/02/21      PT LONG TERM GOAL #4   Title Pt's FOTO score will meet the predicted value of 71%    Baseline 66%    Status Achieved    Target Date 03/02/21      PT LONG TERM GOAL #5   Title Pt will be able to demonstrate proper sitting posture to assit in the reduction of cervical strain/pain. 10/14: Pt demonstrates proper sitting posture    Status Achieved    Target Date 03/02/21                   Plan - 03/02/21 0849     Clinical Impression Statement Pt presents to PT today having returned back to her job on full duty and reporting no issues iwth pain for 2+ weeks. Pt has met all LTGs noted below and is Ind in a final HEP to maintain achieved LOF and QOL. Pt is DCed from PT services with pt is agreement with DC.    Personal Factors and Comorbidities Fitness    Examination-Participation Restrictions Occupation    Stability/Clinical Decision Making Stable/Uncomplicated    Clinical Decision Making Low    Rehab Potential Good    PT Frequency 2x / week    PT Treatment/Interventions ADLs/Self Care Home Management;Cryotherapy;Electrical Stimulation;Ultrasound;Traction;Moist Heat;Iontophoresis 51m/ml Dexamethasone;Therapeutic exercise;Manual techniques;Patient/family education;Taping;Spinal Manipulations;Passive range of motion;Dry needling    PT Next Visit Plan --    PT Home Exercise Plan [id number]    Consulted and Agree with Plan of Care Patient             Patient will benefit from skilled therapeutic intervention in order to improve the following deficits and impairments:  Obesity, Impaired UE functional use, Pain, Postural dysfunction, Impaired tone  Visit Diagnosis: Cervicalgia  Other muscle spasm  Abnormal posture     Problem List Patient Active Problem List   Diagnosis Date Noted   S/P cesarean section 02/18/2015    PHYSICAL THERAPY DISCHARGE SUMMARY  Visits from Start of Care: 3  Current functional level related to goals /  functional outcomes: See above   Remaining deficits: See above   Education / Equipment: HEP  Patient agrees to discharge. Patient goals were met. Patient is being discharged due to being pleased with the current functional level.  Gar Ponto MS, PT 03/03/21 8:53 AM   [facility] [address] [place], [place], [address] Phone: [phone]   Fax:  [phone]  Name: Leslie Hatfield MRN: [id number] Date of Birth: [birth year]

## 2021-03-07 ENCOUNTER — Other Ambulatory Visit: Payer: Self-pay

## 2021-03-07 ENCOUNTER — Encounter (HOSPITAL_BASED_OUTPATIENT_CLINIC_OR_DEPARTMENT_OTHER): Payer: Self-pay

## 2021-03-07 ENCOUNTER — Ambulatory Visit: Payer: PRIVATE HEALTH INSURANCE

## 2021-03-07 DIAGNOSIS — R319 Hematuria, unspecified: Secondary | ICD-10-CM | POA: Insufficient documentation

## 2021-03-07 DIAGNOSIS — R109 Unspecified abdominal pain: Secondary | ICD-10-CM | POA: Insufficient documentation

## 2021-03-07 DIAGNOSIS — M545 Low back pain, unspecified: Secondary | ICD-10-CM | POA: Diagnosis not present

## 2021-03-07 LAB — URINALYSIS, ROUTINE W REFLEX MICROSCOPIC
Bilirubin Urine: NEGATIVE
Glucose, UA: NEGATIVE mg/dL
Ketones, ur: NEGATIVE mg/dL
Leukocytes,Ua: NEGATIVE
Nitrite: NEGATIVE
Protein, ur: NEGATIVE mg/dL
Specific Gravity, Urine: 1.03 (ref 1.005–1.030)
pH: 7 (ref 5.0–8.0)

## 2021-03-07 LAB — URINALYSIS, MICROSCOPIC (REFLEX): WBC, UA: NONE SEEN WBC/hpf (ref 0–5)

## 2021-03-07 LAB — PREGNANCY, URINE: Preg Test, Ur: NEGATIVE

## 2021-03-07 NOTE — ED Triage Notes (Signed)
Pt c/o mid right back pain started 10/15-was seen-seen [facility] 10/17-pt states she was taking flexeril-UC added naproxyn-states pain is no better-denies injury-NAD-steady gait

## 2021-03-08 ENCOUNTER — Emergency Department (HOSPITAL_BASED_OUTPATIENT_CLINIC_OR_DEPARTMENT_OTHER): Payer: No Typology Code available for payment source

## 2021-03-08 ENCOUNTER — Emergency Department (HOSPITAL_BASED_OUTPATIENT_CLINIC_OR_DEPARTMENT_OTHER)
Admission: EM | Admit: 2021-03-08 | Discharge: 2021-03-08 | Disposition: A | Discharge status: Home / Self Care | Payer: No Typology Code available for payment source | Attending: Emergency Medicine | Admitting: Emergency Medicine

## 2021-03-08 DIAGNOSIS — M545 Low back pain, unspecified: Secondary | ICD-10-CM

## 2021-03-08 DIAGNOSIS — R319 Hematuria, unspecified: Secondary | ICD-10-CM

## 2021-03-08 MED ORDER — DIAZEPAM 5 MG PO TABS
5.0000 mg | ORAL_TABLET | Freq: Once | ORAL | Status: AC
  Administered 2021-03-08: 5 mg via ORAL
  Filled 2021-03-08: qty 1

## 2021-03-08 MED ORDER — METHOCARBAMOL 500 MG PO TABS: 1000.0000 mg | ORAL_TABLET | Freq: Three times a day (TID) | ORAL | 0 refills | Status: DC | PRN

## 2021-03-08 MED ORDER — OXYCODONE-ACETAMINOPHEN 5-325 MG PO TABS
1.0000 | ORAL_TABLET | Freq: Once | ORAL | Status: AC
Start: 2021-03-08 — End: 2021-03-08
  Administered 2021-03-08: 1 via ORAL
  Filled 2021-03-08: qty 1

## 2021-03-08 MED ORDER — DICLOFENAC SODIUM 1 % EX GEL: 2.0000 g | Freq: Four times a day (QID) | CUTANEOUS | 0 refills | Status: DC | PRN

## 2021-03-08 MED ORDER — PREDNISONE 20 MG PO TABS: 20.0000 mg | ORAL_TABLET | Freq: Every day | ORAL | 0 refills | Status: AC

## 2021-03-08 NOTE — Discharge Instructions (Signed)

## 2021-03-08 NOTE — ED Provider Notes (Signed)
Emergency Department Provider Note   I have reviewed the triage vital signs and the nursing notes.   HISTORY  Chief Complaint Back Pain   HPI Leslie Hatfield is a 35 y.o. female with past medical history reviewed below presents emergency department with back pain.  Pain initially began on the 15th.  She was seen at urgent care and has been taking naproxen along with Flexeril.  Symptoms or not improving.  She denies radiation of pain down the legs.  No back injury.  No weakness/numbness in the legs.  No fever/chills.  No urine retention or incontinence symptoms.  Denies any UTI symptoms.  She presents today with worsening symptoms.  Past Medical History:  Diagnosis Date   Anemia    No pertinent past medical history     Patient Active Problem List   Diagnosis Date Noted   S/P cesarean section 02/18/2015    Past Surgical History:  Procedure Laterality Date   CESAREAN SECTION  12/04/2010   Procedure: CESAREAN SECTION;  Surgeon: Oliver Pila;  Location: [facility];  Service: Gynecology;  Laterality: N/A;   CESAREAN SECTION     CESAREAN SECTION N/A 02/18/2015   Procedure: CESAREAN SECTION;  Surgeon: Lavina Hamman, MD;  Location: [facility];  Service: Obstetrics;  Laterality: N/A;   vaginal poloyps     vaginal polyps removed      Allergies Patient has no known allergies.  Family History  Problem Relation Age of Onset   Heart disease Maternal Grandmother    Heart disease Maternal Grandfather    Diabetes Paternal Grandfather    Diabetes Paternal Grandmother     Social History Social History   Tobacco Use   Smoking status: Never   Smokeless tobacco: Never  Vaping Use   Vaping Use: Never used  Substance Use Topics   Alcohol use: No   Drug use: No    Review of Systems  Constitutional: No fever/chills Eyes: No visual changes. ENT: No sore throat. Cardiovascular: Denies chest pain. Respiratory: Denies shortness of breath. Gastrointestinal: No abdominal pain.  No  nausea, no vomiting.  No diarrhea.  No constipation. Genitourinary: Negative for dysuria. Musculoskeletal: Positive for back pain. Skin: Negative for rash. Neurological: Negative for headaches, focal weakness or numbness.  10-point ROS otherwise negative.  ____________________________________________   PHYSICAL EXAM:  VITAL SIGNS: ED Triage Vitals  Enc Vitals Group     BP 03/07/21 2119 (!) 163/93     Pulse Rate 03/07/21 2119 (!) 111     Resp 03/07/21 2119 18     Temp 03/07/21 2119 98 F (36.7 C)     Temp Source 03/07/21 2119 Oral     SpO2 03/07/21 2119 99 %     Weight 03/07/21 2121 275 lb (124.7 kg)     Height 03/07/21 2121 5\' 4"  (1.626 m)   Constitutional: Alert and oriented. Well appearing and in no acute distress. Eyes: Conjunctivae are normal.  Head: Atraumatic. Nose: No congestion/rhinnorhea. Mouth/Throat: Mucous membranes are moist.  Neck: No stridor.  Cardiovascular: Normal rate, regular rhythm. Good peripheral circulation. Grossly normal heart sounds.   Respiratory: Normal respiratory effort.  No retractions. Lungs CTAB. Gastrointestinal: Soft and nontender. No distention.  Musculoskeletal: No lower extremity tenderness nor edema. No gross deformities of extremities. Diffuse midline and paraspinal tenderness in the lumbar region.  Neurologic:  Normal speech and language. No gross focal neurologic deficits are appreciated.  Skin:  Skin is warm, dry and intact. No rash noted.  ____________________________________________   (  all labs ordered are listed, but only abnormal results are displayed)  Labs Reviewed  URINALYSIS, ROUTINE W REFLEX MICROSCOPIC - Abnormal; Notable for the following components:      Result Value   APPearance CLOUDY (*)    Hgb urine dipstick MODERATE (*)    All other components within normal limits  URINALYSIS, MICROSCOPIC (REFLEX) - Abnormal; Notable for the following components:   Bacteria, UA FEW (*)    All other components within  normal limits  PREGNANCY, URINE   ____________________________________________  RADIOLOGY  CT renal reviewed.   ____________________________________________   PROCEDURES  Procedure(s) performed:   Procedures  None  ____________________________________________   INITIAL IMPRESSION / ASSESSMENT AND PLAN / ED COURSE  Pertinent labs & imaging results that were available during my care of the patient were reviewed by me and considered in my medical decision making (see chart for details).   Patient presents emergency department for evaluation of lower back pain, slightly to the right.   Differential diagnosis includes but is not exclusive to musculoskeletal back pain, renal colic, urinary tract infection, pyelonephritis, intra-abdominal causes of back pain, aortic aneurysm or dissection, cauda equina syndrome, sciatica, lumbar disc disease, thoracic disc disease, etc.  Patient with no red flag signs or symptoms to prompt emergent MRI.  CT renal obtained evaluating for possible ureteral stone but no acute finding on CT to explain symptoms.  Patient does have some mild hematuria but no evidence of infection.  Pregnancy is negative.  Pain improved here with symptom management.  Plan for close PCP follow-up and symptom mgmt at home. Contact info given for spine specialist for outpatient follow up.   ____________________________________________  FINAL CLINICAL IMPRESSION(S) / ED DIAGNOSES  Final diagnoses:  Acute midline low back pain without sciatica  Hematuria, unspecified type     MEDICATIONS GIVEN DURING THIS VISIT:  Medications  diazepam (VALIUM) tablet 5 mg (5 mg Oral Given 03/08/21 0214)  oxyCODONE-acetaminophen (PERCOCET/ROXICET) 5-325 MG per tablet 1 tablet (1 tablet Oral Given 03/08/21 0327)     NEW OUTPATIENT MEDICATIONS STARTED DURING THIS VISIT:  Discharge Medication List as of 03/08/2021  3:01 AM     START taking these medications   Details  diclofenac  Sodium (VOLTAREN) 1 % GEL Apply 2 g topically 4 (four) times daily as needed., Starting Thu 03/08/2021, Normal    methocarbamol (ROBAXIN) 500 MG tablet Take 2 tablets (1,000 mg total) by mouth every 8 (eight) hours as needed for muscle spasms., Starting Thu 03/08/2021, Normal    predniSONE (DELTASONE) 20 MG tablet Take 1 tablet (20 mg total) by mouth daily for 5 days., Starting Thu 03/08/2021, Until Tue 03/13/2021, Normal        Note:  This document was prepared using Dragon voice recognition software and may include unintentional dictation errors.  Alona Bene, MD, [facility] Emergency Medicine    Sammuel Blick, Arlyss Repress, MD 03/10/21 [phone]

## 2022-06-13 ENCOUNTER — Encounter (HOSPITAL_COMMUNITY): Payer: Self-pay | Admitting: Emergency Medicine

## 2022-06-13 ENCOUNTER — Emergency Department (HOSPITAL_COMMUNITY): Payer: 59

## 2022-06-13 ENCOUNTER — Emergency Department (HOSPITAL_COMMUNITY)
Admission: EM | Admit: 2022-06-13 | Discharge: 2022-06-14 | Disposition: A | Discharge status: Home / Self Care | Payer: 59 | Attending: Emergency Medicine | Admitting: Emergency Medicine

## 2022-06-13 ENCOUNTER — Other Ambulatory Visit: Payer: Self-pay

## 2022-06-13 DIAGNOSIS — R112 Nausea with vomiting, unspecified: Secondary | ICD-10-CM | POA: Diagnosis not present

## 2022-06-13 DIAGNOSIS — R7401 Elevation of levels of liver transaminase levels: Secondary | ICD-10-CM | POA: Insufficient documentation

## 2022-06-13 DIAGNOSIS — R000 Tachycardia, unspecified: Secondary | ICD-10-CM | POA: Diagnosis not present

## 2022-06-13 DIAGNOSIS — R1013 Epigastric pain: Secondary | ICD-10-CM | POA: Diagnosis present

## 2022-06-13 DIAGNOSIS — R1084 Generalized abdominal pain: Secondary | ICD-10-CM | POA: Diagnosis not present

## 2022-06-13 DIAGNOSIS — R1011 Right upper quadrant pain: Secondary | ICD-10-CM | POA: Diagnosis not present

## 2022-06-13 DIAGNOSIS — R109 Unspecified abdominal pain: Secondary | ICD-10-CM | POA: Diagnosis not present

## 2022-06-13 DIAGNOSIS — R197 Diarrhea, unspecified: Secondary | ICD-10-CM | POA: Diagnosis not present

## 2022-06-13 DIAGNOSIS — K760 Fatty (change of) liver, not elsewhere classified: Secondary | ICD-10-CM | POA: Diagnosis not present

## 2022-06-13 LAB — CBC WITH DIFFERENTIAL/PLATELET
Abs Immature Granulocytes: 0.02 10*3/uL (ref 0.00–0.07)
Basophils Absolute: 0 10*3/uL (ref 0.0–0.1)
Basophils Relative: 0 %
Eosinophils Absolute: 0.1 10*3/uL (ref 0.0–0.5)
Eosinophils Relative: 1 %
HCT: 45.6 % (ref 36.0–46.0)
Hemoglobin: 14.5 g/dL (ref 12.0–15.0)
Immature Granulocytes: 0 %
Lymphocytes Relative: 9 %
Lymphs Abs: 0.8 10*3/uL (ref 0.7–4.0)
MCH: 27.7 pg (ref 26.0–34.0)
MCHC: 31.8 g/dL (ref 30.0–36.0)
MCV: 87 fL (ref 80.0–100.0)
Monocytes Absolute: 0.4 10*3/uL (ref 0.1–1.0)
Monocytes Relative: 5 %
Neutro Abs: 7.2 10*3/uL (ref 1.7–7.7)
Neutrophils Relative %: 85 %
Platelets: 328 10*3/uL (ref 150–400)
RBC: 5.24 MIL/uL — ABNORMAL HIGH (ref 3.87–5.11)
RDW: 14.6 % (ref 11.5–15.5)
WBC: 8.4 10*3/uL (ref 4.0–10.5)
nRBC: 0 % (ref 0.0–0.2)

## 2022-06-13 LAB — COMPREHENSIVE METABOLIC PANEL
ALT: 53 U/L — ABNORMAL HIGH (ref 0–44)
AST: 54 U/L — ABNORMAL HIGH (ref 15–41)
Albumin: 4.3 g/dL (ref 3.5–5.0)
Alkaline Phosphatase: 79 U/L (ref 38–126)
Anion gap: 13 (ref 5–15)
BUN: 13 mg/dL (ref 6–20)
CO2: 20 mmol/L — ABNORMAL LOW (ref 22–32)
Calcium: 9.1 mg/dL (ref 8.9–10.3)
Chloride: 102 mmol/L (ref 98–111)
Creatinine, Ser: 0.65 mg/dL (ref 0.44–1.00)
GFR, Estimated: >60 mL/min (ref >60)
Glucose, Bld: 138 mg/dL — ABNORMAL HIGH (ref 70–99)
Potassium: 3.9 mmol/L (ref 3.5–5.1)
Sodium: 135 mmol/L (ref 135–145)
Total Bilirubin: 0.6 mg/dL (ref 0.3–1.2)
Total Protein: 8.6 g/dL — ABNORMAL HIGH (ref 6.5–8.1)

## 2022-06-13 LAB — I-STAT BETA HCG BLOOD, ED (MC, WL, AP ONLY): I-stat hCG, quantitative: <5 m[IU]/mL (ref <5)

## 2022-06-13 LAB — LIPASE, BLOOD: Lipase: 36 U/L (ref 11–51)

## 2022-06-13 MED ORDER — SODIUM CHLORIDE 0.9 % IV BOLUS
1000.0000 mL | Freq: Once | INTRAVENOUS | Status: AC
  Administered 2022-06-13: 1000 mL via INTRAVENOUS

## 2022-06-13 MED ORDER — IOHEXOL 300 MG/ML  SOLN
100.0000 mL | Freq: Once | INTRAMUSCULAR | Status: AC | PRN
  Administered 2022-06-14: 100 mL via INTRAVENOUS

## 2022-06-13 MED ORDER — ONDANSETRON HCL 4 MG/2ML IJ SOLN
4.0000 mg | Freq: Once | INTRAMUSCULAR | Status: AC | PRN
  Administered 2022-06-13: 4 mg via INTRAVENOUS
  Filled 2022-06-13: qty 2

## 2022-06-13 MED ORDER — SODIUM CHLORIDE (PF) 0.9 % IJ SOLN
INTRAMUSCULAR | Status: AC
  Filled 2022-06-13: qty 50

## 2022-06-13 MED ORDER — SODIUM CHLORIDE 0.9 % IV BOLUS
1000.0000 mL | Freq: Once | INTRAVENOUS | Status: AC
  Administered 2022-06-14: 1000 mL via INTRAVENOUS

## 2022-06-13 MED ORDER — SODIUM CHLORIDE 0.9 % IV SOLN
12.5000 mg | Freq: Once | INTRAVENOUS | Status: AC
  Administered 2022-06-14: 12.5 mg via INTRAVENOUS
  Filled 2022-06-13: qty 12.5

## 2022-06-13 MED ORDER — MORPHINE SULFATE (PF) 4 MG/ML IV SOLN
4.0000 mg | Freq: Once | INTRAVENOUS | Status: AC
  Administered 2022-06-13: 4 mg via INTRAVENOUS
  Filled 2022-06-13: qty 1

## 2022-06-13 NOTE — ED Triage Notes (Signed)
Presents from home for N/V/D and RUQ abd pain, epigastric burning since last night. H/o "sludge in gallbadder" a few years ago, still has gall bladder.   Endorses chills, unable to tolerate PO intake.  Denies radiation of pain into upper back.   No daily medications. Took a 4mg  ODT zofran at 9am, imodium 11am. No relief

## 2022-06-13 NOTE — ED Notes (Signed)
Labs sent - gold, lav, lt greem, blue

## 2022-06-13 NOTE — ED Provider Notes (Signed)
[place] [facility] DE[facility]MENT AT [facility] Provider Note   CSN: [id number] Arrival date & time: 06/13/22  1928     History  Chief Complaint  Patient presents with   Abdominal Pain    Leslie Hatfield is a 37 y.o. female.  Pt is a 37 yo female with no significant pmhx.  Pt woke up this am around 0100 with epigastric abd pain, n/v/d.  She has been unable to keep down any fluids.  Pt took oral zofran, but that did not help.         Home Medications Prior to Admission medications   Medication Sig Start Date End Date Taking? Authorizing Provider  acetaminophen (TYLENOL) 500 MG tablet Take 500 mg by mouth every 6 (six) hours as needed.    [provider]  cyclobenzaprine (FLEXERIL) 10 MG tablet Take 1 tablet (10 mg total) by mouth at bedtime for muscle spasms and pain Patient not taking: Reported on 01/31/2021 01/05/21   Kendell Bane, NP  diclofenac Sodium (VOLTAREN) 1 % GEL Apply 2 g topically 4 (four) times daily as needed. 03/08/21   Long, Wonda Olds, MD  diphenhydramine-acetaminophen (TYLENOL PM) 25-500 MG TABS tablet Take 2 tablets by mouth at bedtime as needed (sleep,pain). Patient not taking: Reported on 01/31/2021    [provider]  hydrOXYzine (ATARAX/VISTARIL) 25 MG tablet Take 1 tablet (25 mg total) by mouth every 6 (six) hours. Patient not taking: Reported on 01/31/2021 09/11/17   Okey Regal, PA-C  IUD'S IU 1 each by Intrauterine route continuous.    [provider]  lidocaine (XYLOCAINE) 2 % solution Use as directed 15 mLs in the mouth or throat as needed for mouth pain. Patient not taking: Reported on 01/31/2021 06/30/17   Delia Heady, PA-C  methocarbamol (ROBAXIN) 500 MG tablet Take 2 tablets (1,000 mg total) by mouth every 8 (eight) hours as needed for muscle spasms. 03/08/21   Long, Wonda Olds, MD  pantoprazole (PROTONIX) 40 MG tablet Take 1 tablet (40 mg total) by mouth daily. Patient not taking: Reported on 01/31/2021 05/25/20    Orpah Greek, MD  phenol (CHLORASEPTIC) 1.4 % LIQD Use as directed 2 sprays in the mouth or throat as needed for throat irritation / pain. Patient not taking: Reported on 01/31/2021    [provider]      Allergies    Patient has no known allergies.    Review of Systems   Review of Systems  Gastrointestinal:  Positive for abdominal pain, diarrhea, nausea and vomiting.  All other systems reviewed and are negative.   Physical Exam Updated Vital Signs BP 111/75   Pulse (!) 113   Temp 98.3 F (36.8 C) (Oral)   Resp (!) 22   Wt 122.5 kg   LMP 06/09/2022 (Approximate)   SpO2 95%   BMI 46.35 kg/m  Physical Exam Vitals and nursing note reviewed.  Constitutional:      Appearance: She is well-developed. She is obese.  HENT:     Head: Normocephalic and atraumatic.     Mouth/Throat:     Mouth: Mucous membranes are dry.     Pharynx: Oropharynx is clear.  Eyes:     Extraocular Movements: Extraocular movements intact.     Pupils: Pupils are equal, round, and reactive to light.  Cardiovascular:     Rate and Rhythm: Regular rhythm. Tachycardia present.     Heart sounds: Normal heart sounds.  Abdominal:     General: Abdomen is flat. Bowel  sounds are normal.     Palpations: Abdomen is soft.     Tenderness: There is abdominal tenderness in the right upper quadrant and epigastric area.  Skin:    General: Skin is warm.     Capillary Refill: Capillary refill takes less than 2 seconds.  Neurological:     General: No focal deficit present.     Mental Status: She is alert and oriented to person, place, and time.  Psychiatric:        Mood and Affect: Mood normal.        Behavior: Behavior normal.     ED Results / Procedures / Treatments   Labs (all labs ordered are listed, but only abnormal results are displayed) Labs Reviewed  COMPREHENSIVE METABOLIC PANEL - Abnormal; Notable for the following components:      Result Value   CO2 20 (*)    Glucose, Bld 138 (*)     Total Protein 8.6 (*)    AST 54 (*)    ALT 53 (*)    All other components within normal limits  CBC WITH DIFFERENTIAL/PLATELET - Abnormal; Notable for the following components:   RBC 5.24 (*)    All other components within normal limits  LIPASE, BLOOD  URINALYSIS, ROUTINE W REFLEX MICROSCOPIC  I-STAT BETA HCG BLOOD, ED ([facility], [facility], [facility] ONLY)    EKG None  Radiology US Abdomen Limited  Result Date: 06/13/2022 CLINICAL DATA:  Right upper quadrant abdominal pain. EXAM: ULTRASOUND ABDOMEN LIMITED RIGHT UPPER QUADRANT COMPARISON:  Ultrasound dated 05/25/2020 and CT abdomen pelvis dated 03/08/2021. FINDINGS: Gallbladder: No gallstones or wall thickening visualized. No sonographic Murphy sign noted by sonographer. Common bile duct: Diameter: 3 mm Liver: There is diffuse increased liver echogenicity most commonly seen in the setting of fatty infiltration. Superimposed inflammation or fibrosis is not excluded. Clinical correlation is recommended. Portal vein is patent on color Doppler imaging with normal direction of blood flow towards the liver. Other: None. IMPRESSION: Fatty liver, otherwise unremarkable right upper quadrant ultrasound. Electronically Signed   By: Anner Crete M.D.   On: 06/13/2022 21:31    Procedures Procedures    Medications Ordered in ED Medications  sodium chloride 0.9 % bolus 1,000 mL (has no administration in time range)  ondansetron (ZOFRAN) injection 4 mg (4 mg Intravenous Given 06/13/22 1957)  sodium chloride 0.9 % bolus 1,000 mL (1,000 mLs Intravenous New Bag/Given 06/13/22 2146)  morphine (PF) 4 MG/ML injection 4 mg (4 mg Intravenous Given 06/13/22 2144)    ED Course/ Medical Decision Making/ A&P                             Medical Decision Making Amount and/or Complexity of Data Reviewed Labs: ordered. Radiology: ordered.  Risk Prescription drug management.   This patient presents to the ED for concern of abd pain, this involves an extensive number of  treatment options, and is a complaint that carries with it a high risk of complications and morbidity.  The differential diagnosis includes gastritis, pancreatitis, cholelithiasis, viral gastroenteritis   Co morbidities that complicate the patient evaluation  none   Additional history obtained:  Additional history obtained from epic chart review External records from outside source obtained and reviewed including family   Lab Tests:  I Ordered, and personally interpreted labs.  The pertinent results include:  cbc nl, cmp nl other than AST elevated at 54 and ALT 53   Imaging Studies ordered:  I ordered imaging studies including [place] abd and CT abd/pelvis I independently visualized and interpreted imaging which showed  [place]: Fatty liver, otherwise unremarkable right upper quadrant ultrasound.  CT abd/pelvis: pending at shift change  I agree with the radiologist interpretation   Cardiac Monitoring:  The patient was maintained on a cardiac monitor.  I personally viewed and interpreted the cardiac monitored which showed an underlying rhythm of: st   Medicines ordered and prescription drug management:  I ordered medication including morphine/zofran  for pain and nausea  Reevaluation of the patient after these medicines showed that the patient improved I have reviewed the patients home medicines and have made adjustments as needed   Test Considered:  ct   Problem List / ED Course:  Abd pain:  labs are ok; ct pending at shift change.  Pt signed out to Dr. Madilyn Hook.   Reevaluation:  After the interventions noted above, I reevaluated the patient and found that they have :improved   Social Determinants of Health:  Lives at home   Dispostion:         Final Clinical Impression(s) / ED Diagnoses Final diagnoses:  Generalized abdominal pain    Rx / DC Orders ED Discharge Orders     None         Jacalyn Lefevre, MD 06/13/22 2313

## 2022-06-13 NOTE — ED Notes (Signed)
Need preg test for CT

## 2022-06-13 NOTE — ED Notes (Signed)
Labeled specimen cup given to pt for U/A collection per MD order. Pt advised to use call bell for assistance as needed. [employer]

## 2022-06-13 NOTE — ED Provider Notes (Signed)
Care assumed at 2300.  Patient here for evaluation of epigastric abdominal pain, vomiting and diarrhea.  Care assumed pending CT abdomen pelvis and urinalysis.  UA is not consistent with UTI.  CT abdomen pelvis is negative for acute abnormality.  Discussed with patient and incidental finding of hepatic steatosis.  LFTs are minimally elevated-this is similar when compared to priors in the record.   Patient is able to tolerate oral fluids in the emergency department.  Her nausea is improved.  She has had diarrhea since she has been here.  No hematochezia or melena.  She does have some mild epigastric discomfort.  Current clinical picture is not consistent with cholecystitis, cholangitis, pancreatitis.  Patient does have mild tachycardia on repeat evaluation with heart rates around 100, no hypoxia.  Current clinical picture is not consistent with PE.  She is stable at this point for discharge home.  Will provide prescription for antiemetic as well as Pepcid for some possible gastritis.  Discussed close return precautions if she develops new or concerning symptoms.   Quintella Reichert, MD 06/14/22 [phone]

## 2022-06-13 NOTE — ED Provider Triage Note (Signed)
Emergency Medicine Provider Triage Evaluation Note  Leslie Hatfield , a 37 y.o. female  was evaluated in triage.  Pt complains of right upper quadrant pain, nausea, vomiting, diarrhea that started last night.  Patient has history significant for gallbladder issues and elevated LFTs, but has had no other abdominal surgeries aside from cesarean sections.  Denies fever but had chills.  Unable to tolerate p.o. intake.  She took a 4 mg ODT Zofran at 9 AM without relief.  Denies chest pain, shortness of breath.    Review of Systems  Positive: As above Negative: As above  Physical Exam  BP (!) 153/103 (BP Location: Right Arm)   Pulse (!) 124   Temp 98.3 F (36.8 C) (Oral)   Resp 18   Wt 122.5 kg   LMP 06/09/2022 (Approximate)   SpO2 97%   BMI 46.35 kg/m  Gen:   Awake, no distress   Resp:  Normal effort  MSK:   Moves extremities without difficulty  Other:  RUQ and epigastric abdominal tenderness with palpation  Medical Decision Making  Medically screening exam initiated at 7:54 PM.  Appropriate orders placed.  Smt. Hoelzer was informed that the remainder of the evaluation will be completed by another provider, this initial triage assessment does not replace that evaluation, and the importance of remaining in the ED until their evaluation is complete.     Pat Kocher, [place] 06/13/22 [phone]

## 2022-06-14 DIAGNOSIS — K760 Fatty (change of) liver, not elsewhere classified: Secondary | ICD-10-CM | POA: Diagnosis not present

## 2022-06-14 DIAGNOSIS — R109 Unspecified abdominal pain: Secondary | ICD-10-CM | POA: Diagnosis not present

## 2022-06-14 LAB — URINALYSIS, ROUTINE W REFLEX MICROSCOPIC
Bacteria, UA: NONE SEEN
Bilirubin Urine: NEGATIVE
Glucose, UA: NEGATIVE mg/dL
Ketones, ur: NEGATIVE mg/dL
Leukocytes,Ua: NEGATIVE
Nitrite: NEGATIVE
Protein, ur: NEGATIVE mg/dL
Specific Gravity, Urine: 1.015 (ref 1.005–1.030)
pH: 5 (ref 5.0–8.0)

## 2022-06-14 MED ORDER — PROMETHAZINE HCL 25 MG PO TABS: 25.0000 mg | ORAL_TABLET | Freq: Three times a day (TID) | ORAL | 0 refills | Status: DC | PRN

## 2022-06-14 MED ORDER — LACTATED RINGERS IV BOLUS
1000.0000 mL | Freq: Once | INTRAVENOUS | Status: AC
  Administered 2022-06-14: 1000 mL via INTRAVENOUS

## 2022-06-14 MED ORDER — FAMOTIDINE 20 MG PO TABS: 20.0000 mg | ORAL_TABLET | Freq: Two times a day (BID) | ORAL | 0 refills | Status: DC

## 2022-06-14 NOTE — ED Notes (Signed)
Pt feeling better. Ambulatory and getting dressed.

## 2022-06-14 NOTE — ED Notes (Signed)
Pt given water for PO challenge 

## 2022-07-27 ENCOUNTER — Ambulatory Visit
Admission: EM | Admit: 2022-07-27 | Discharge: 2022-07-27 | Disposition: A | Discharge status: Home / Self Care | Payer: 59 | Attending: Urgent Care | Admitting: Urgent Care

## 2022-07-27 DIAGNOSIS — Z1152 Encounter for screening for COVID-19: Secondary | ICD-10-CM | POA: Diagnosis not present

## 2022-07-27 DIAGNOSIS — J069 Acute upper respiratory infection, unspecified: Secondary | ICD-10-CM

## 2022-07-27 DIAGNOSIS — J029 Acute pharyngitis, unspecified: Secondary | ICD-10-CM | POA: Diagnosis not present

## 2022-07-27 LAB — POCT INFLUENZA A/B
Influenza A, POC: NEGATIVE
Influenza B, POC: NEGATIVE

## 2022-07-27 LAB — POCT RAPID STREP A (OFFICE): Rapid Strep A Screen: NEGATIVE

## 2022-07-27 MED ORDER — FLUTICASONE PROPIONATE 50 MCG/ACT NA SUSP: 1.0000 | Freq: Every day | NASAL | 0 refills | Status: AC

## 2022-07-27 MED ORDER — PROMETHAZINE-DM 6.25-15 MG/5ML PO SYRP: 5.0000 mL | ORAL_SOLUTION | Freq: Four times a day (QID) | ORAL | 0 refills | Status: DC | PRN

## 2022-07-27 NOTE — Discharge Instructions (Addendum)
Rapid strep and rapid flu are negative.  Throat culture and COVID test pending.  Suspect you have a viral illness that should run its course and self resolve with the help of symptomatic treatment.  I have sent you a few medications to help alleviate symptoms.  Please be advised the cough medication that can make you drowsy.  Follow-up if any symptoms persist or worsen.

## 2022-07-27 NOTE — ED Triage Notes (Signed)
Patient presents to UC for sore throat, congestion, cough x 3 days. Exposed to flu and strep. Taking OTC cold med.

## 2022-07-27 NOTE — ED Provider Notes (Addendum)
[facility]    CSN: [id number] Arrival date & time: 07/27/22  0802      History   Chief Complaint Chief Complaint  Patient presents with   Sore Throat   Cough   Nasal Congestion    HPI Leslie Hatfield is a 37 y.o. female.   Patient presents with sore throat, nasal congestion, cough that has been present for 3 days.  Patient reports that she was rapid strep was negative.  Throat exposed to influenza and strep at her workplace.  Denies any known fevers at home but has had chills.  Denies chest pain, shortness of breath, gastrointestinal symptoms.  Patient has used Chloraseptic spray for symptoms with minimal improvement.  Denies history of asthma and patient does not smoke cigarettes.   Sore Throat  Cough   Past Medical History:  Diagnosis Date   Anemia    No pertinent past medical history     Patient Active Problem List   Diagnosis Date Noted   S/P cesarean section 02/18/2015    Past Surgical History:  Procedure Laterality Date   CESAREAN SECTION  12/04/2010   Procedure: CESAREAN SECTION;  Surgeon: Logan Bores;  Location: [facility];  Service: Gynecology;  Laterality: N/A;   CESAREAN SECTION     CESAREAN SECTION N/A 02/18/2015   Procedure: CESAREAN SECTION;  Surgeon: Cheri Juddson Cobern, MD;  Location: [facility];  Service: Obstetrics;  Laterality: N/A;   vaginal poloyps     vaginal polyps removed      OB History     Gravida  4   Para  2   Term  2   Preterm      AB  2   Living  1      SAB  2   IAB      Ectopic      Multiple  0   Live Births  1            Home Medications    Prior to Admission medications   Medication Sig Start Date End Date Taking? Authorizing Provider  fluticasone (FLONASE) 50 MCG/ACT nasal spray Place 1 spray into both nostrils daily. 07/27/22  Yes Tagan Bartram, Michele Rockers, FNP  promethazine-dextromethorphan (PROMETHAZINE-DM) 6.25-15 MG/5ML syrup Take 5 mLs by mouth every 6 (six) hours as needed for cough. 07/27/22  Yes  Kainoa Swoboda, Hildred Alamin E, FNP  acetaminophen (TYLENOL) 500 MG tablet Take 500 mg by mouth every 6 (six) hours as needed.    [provider]  cyclobenzaprine (FLEXERIL) 10 MG tablet Take 1 tablet (10 mg total) by mouth at bedtime for muscle spasms and pain Patient not taking: Reported on 01/31/2021 01/05/21   Kendell Bane, NP  diclofenac Sodium (VOLTAREN) 1 % GEL Apply 2 g topically 4 (four) times daily as needed. 03/08/21   Long, Wonda Olds, MD  diphenhydramine-acetaminophen (TYLENOL PM) 25-500 MG TABS tablet Take 2 tablets by mouth at bedtime as needed (sleep,pain). Patient not taking: Reported on 01/31/2021    [provider]  famotidine (PEPCID) 20 MG tablet Take 1 tablet (20 mg total) by mouth 2 (two) times daily. 06/14/22   Quintella Reichert, MD  hydrOXYzine (ATARAX/VISTARIL) 25 MG tablet Take 1 tablet (25 mg total) by mouth every 6 (six) hours. Patient not taking: Reported on 01/31/2021 09/11/17   Okey Regal, PA-C  IUD'S IU 1 each by Intrauterine route continuous.    [provider]  lidocaine (XYLOCAINE) 2 % solution Use as directed 15 mLs in the mouth or  throat as needed for mouth pain. Patient not taking: Reported on 01/31/2021 06/30/17   Delia Heady, PA-C  methocarbamol (ROBAXIN) 500 MG tablet Take 2 tablets (1,000 mg total) by mouth every 8 (eight) hours as needed for muscle spasms. 03/08/21   Long, Wonda Olds, MD  pantoprazole (PROTONIX) 40 MG tablet Take 1 tablet (40 mg total) by mouth daily. Patient not taking: Reported on 01/31/2021 05/25/20   Orpah Greek, MD  phenol (CHLORASEPTIC) 1.4 % LIQD Use as directed 2 sprays in the mouth or throat as needed for throat irritation / pain. Patient not taking: Reported on 01/31/2021    [provider]    Family History Family History  Problem Relation Age of Onset   Heart disease Maternal Grandmother    Heart disease Maternal Grandfather    Diabetes Paternal Grandfather    Diabetes Paternal Grandmother      Social History Social History   Tobacco Use   Smoking status: Never   Smokeless tobacco: Never  Vaping Use   Vaping Use: Never used  Substance Use Topics   Alcohol use: No   Drug use: No     Allergies   Patient has no known allergies.   Review of Systems Review of Systems Per HPI  Physical Exam Triage Vital Signs ED Triage Vitals  Enc Vitals Group     BP 07/27/22 0814 121/89     Pulse Rate 07/27/22 0814 94     Resp 07/27/22 0814 16     Temp 07/27/22 0814 (!) 97.5 F (36.4 C)     Temp Source 07/27/22 0814 Oral     SpO2 07/27/22 0814 96 %     Weight --      Height --      Head Circumference --      Peak Flow --      Pain Score 07/27/22 0812 0     Pain Loc --      Pain Edu? --      Excl. in [place]? --    No data found.  Updated Vital Signs BP 121/89 (BP Location: Left Arm)   Pulse 94   Temp (!) 97.5 F (36.4 C) (Oral)   Resp 16   LMP 07/18/2022 (Approximate)   SpO2 96%   Breastfeeding No   Visual Acuity Right Eye Distance:   Left Eye Distance:   Bilateral Distance:    Right Eye Near:   Left Eye Near:    Bilateral Near:     Physical Exam Constitutional:      General: She is not in acute distress.    Appearance: Normal appearance. She is not toxic-appearing or diaphoretic.  HENT:     Head: Normocephalic and atraumatic.     Right Ear: Tympanic membrane and ear canal normal.     Left Ear: Tympanic membrane and ear canal normal.     Nose: Congestion present.     Mouth/Throat:     Mouth: Mucous membranes are moist.     Pharynx: Posterior oropharyngeal erythema present.  Eyes:     Extraocular Movements: Extraocular movements intact.     Conjunctiva/sclera: Conjunctivae normal.     Pupils: Pupils are equal, round, and reactive to light.  Cardiovascular:     Rate and Rhythm: Normal rate and regular rhythm.     Pulses: Normal pulses.     Heart sounds: Normal heart sounds.  Pulmonary:     Effort: Pulmonary effort is normal. No respiratory  distress.  Breath sounds: Normal breath sounds. No stridor. No wheezing, rhonchi or rales.  Abdominal:     General: Abdomen is flat. Bowel sounds are normal.     Palpations: Abdomen is soft.  Musculoskeletal:        General: Normal range of motion.     Cervical back: Normal range of motion.  Skin:    General: Skin is warm and dry.  Neurological:     General: No focal deficit present.     Mental Status: She is alert and oriented to person, place, and time. Mental status is at baseline.  Psychiatric:        Mood and Affect: Mood normal.        Behavior: Behavior normal.      UC Treatments / Results  Labs (all labs ordered are listed, but only abnormal results are displayed) Labs Reviewed  CULTURE, GROUP A STREP ([place])  SARS CORONAVIRUS 2 (TAT 6-24 HRS)  POCT RAPID STREP A (OFFICE)  POCT INFLUENZA A/B    EKG   Radiology No results found.  Procedures Procedures (including critical care time)  Medications Ordered in UC Medications - No data to display  Initial Impression / Assessment and Plan / UC Course  I have reviewed the triage vital signs and the nursing notes.  Pertinent labs & imaging results that were available during my care of the patient were reviewed by me and considered in my medical decision making (see chart for details).     Patient presents with symptoms likely from a viral upper respiratory infection.  Do not suspect underlying cardiopulmonary process. Symptoms seem unlikely related to ACS, CHF or COPD exacerbations, pneumonia, pneumothorax. Patient is nontoxic appearing and not in need of emergent medical intervention.  Rapid strep test and rapid flu test were negative.  Throat culture and COVID test pending. Unable to do flu PCR at this time.   Recommended symptom control with medications and supportive care.  Patient was sent prescriptions and advised that cough medication can make her drowsy.  She denies that she takes any daily medication so  this should be safe.  Also denies breast-feeding.  Return if symptoms fail to improve in 1-2 weeks or you develop shortness of breath, chest pain, severe headache. Patient states understanding and is agreeable.  Discharged with PCP followup.  Final Clinical Impressions(s) / UC Diagnoses   Final diagnoses:  Viral upper respiratory tract infection with cough  Sore throat     Discharge Instructions      Rapid strep and rapid flu are negative.  Throat culture and COVID test pending.  Suspect you have a viral illness that should run its course and self resolve with the help of symptomatic treatment.  I have sent you a few medications to help alleviate symptoms.  Please be advised the cough medication that can make you drowsy.  Follow-up if any symptoms persist or worsen.    ED Prescriptions     Medication Sig Dispense Auth. Provider   fluticasone (FLONASE) 50 MCG/ACT nasal spray Place 1 spray into both nostrils daily. 16 g Oswaldo Conroy E, [place]   promethazine-dextromethorphan (PROMETHAZINE-DM) 6.25-15 MG/5ML syrup Take 5 mLs by mouth every 6 (six) hours as needed for cough. 118 mL Teodora Medici, [place]      PDMP not reviewed this encounter.   Teodora Medici, [place] 07/27/22 [place], [place], [place] 07/27/22 [phone]

## 2022-07-28 LAB — SARS CORONAVIRUS 2 (TAT 6-24 HRS): SARS Coronavirus 2: NEGATIVE

## 2022-07-30 LAB — CULTURE, GROUP A STREP (THRC)

## 2022-08-28 DIAGNOSIS — Z130 Encounter for screening for diseases of the blood and blood-forming organs and certain disorders involving the immune mechanism: Secondary | ICD-10-CM | POA: Diagnosis not present

## 2022-08-28 DIAGNOSIS — Z01419 Encounter for gynecological examination (general) (routine) without abnormal findings: Secondary | ICD-10-CM | POA: Diagnosis not present

## 2022-08-28 DIAGNOSIS — Z1389 Encounter for screening for other disorder: Secondary | ICD-10-CM | POA: Diagnosis not present

## 2022-08-28 DIAGNOSIS — Z3009 Encounter for other general counseling and advice on contraception: Secondary | ICD-10-CM | POA: Diagnosis not present

## 2022-08-28 DIAGNOSIS — R300 Dysuria: Secondary | ICD-10-CM | POA: Diagnosis not present

## 2022-08-28 DIAGNOSIS — Z6841 Body Mass Index (BMI) 40.0 and over, adult: Secondary | ICD-10-CM | POA: Diagnosis not present

## 2022-12-23 DIAGNOSIS — N920 Excessive and frequent menstruation with regular cycle: Secondary | ICD-10-CM | POA: Diagnosis not present

## 2023-01-06 DIAGNOSIS — E785 Hyperlipidemia, unspecified: Secondary | ICD-10-CM | POA: Diagnosis not present

## 2023-01-06 DIAGNOSIS — R7309 Other abnormal glucose: Secondary | ICD-10-CM | POA: Diagnosis not present

## 2023-01-06 DIAGNOSIS — D649 Anemia, unspecified: Secondary | ICD-10-CM | POA: Diagnosis not present

## 2023-01-06 DIAGNOSIS — Z6841 Body Mass Index (BMI) 40.0 and over, adult: Secondary | ICD-10-CM | POA: Diagnosis not present

## 2023-01-06 DIAGNOSIS — Z0000 Encounter for general adult medical examination without abnormal findings: Secondary | ICD-10-CM | POA: Diagnosis not present

## 2023-02-26 DIAGNOSIS — N920 Excessive and frequent menstruation with regular cycle: Secondary | ICD-10-CM | POA: Diagnosis not present

## 2023-03-13 ENCOUNTER — Other Ambulatory Visit (HOSPITAL_COMMUNITY): Payer: Self-pay

## 2023-03-13 MED ORDER — TRANEXAMIC ACID 650 MG PO TABS
1300.0000 mg | ORAL_TABLET | Freq: Three times a day (TID) | ORAL | 12 refills | Status: AC
  Filled 2023-03-13: qty 30, 5d supply, fill #0
  Filled 2024-02-25: qty 30, 5d supply, fill #1

## 2023-05-22 ENCOUNTER — Other Ambulatory Visit (HOSPITAL_COMMUNITY): Payer: Self-pay

## 2023-05-22 MED ORDER — TRANEXAMIC ACID 650 MG PO TABS
1300.0000 mg | ORAL_TABLET | Freq: Three times a day (TID) | ORAL | 12 refills | Status: AC
  Filled 2023-05-22: qty 30, 5d supply, fill #0

## 2023-05-29 DIAGNOSIS — N920 Excessive and frequent menstruation with regular cycle: Secondary | ICD-10-CM | POA: Diagnosis not present

## 2023-09-03 ENCOUNTER — Other Ambulatory Visit (HOSPITAL_COMMUNITY): Payer: Self-pay

## 2023-09-03 MED ORDER — WEGOVY 0.25 MG/0.5ML ~~LOC~~ SOAJ
0.2500 mg | SUBCUTANEOUS | 0 refills | Status: DC
  Filled 2023-09-03: qty 2, 28d supply, fill #0

## 2023-10-15 ENCOUNTER — Other Ambulatory Visit (HOSPITAL_COMMUNITY): Payer: Self-pay

## 2023-10-15 DIAGNOSIS — Z6841 Body Mass Index (BMI) 40.0 and over, adult: Secondary | ICD-10-CM | POA: Diagnosis not present

## 2023-10-15 DIAGNOSIS — Z01419 Encounter for gynecological examination (general) (routine) without abnormal findings: Secondary | ICD-10-CM | POA: Diagnosis not present

## 2023-10-15 DIAGNOSIS — N920 Excessive and frequent menstruation with regular cycle: Secondary | ICD-10-CM | POA: Diagnosis not present

## 2023-10-15 DIAGNOSIS — Z1389 Encounter for screening for other disorder: Secondary | ICD-10-CM | POA: Diagnosis not present

## 2023-10-15 DIAGNOSIS — Z130 Encounter for screening for diseases of the blood and blood-forming organs and certain disorders involving the immune mechanism: Secondary | ICD-10-CM | POA: Diagnosis not present

## 2023-10-15 MED ORDER — PHENTERMINE HCL 37.5 MG PO CAPS
37.5000 mg | ORAL_CAPSULE | Freq: Every day | ORAL | 0 refills | Status: AC
  Filled 2023-10-15: qty 30, 30d supply, fill #0

## 2023-10-15 MED ORDER — TRANEXAMIC ACID 650 MG PO TABS
1300.0000 mg | ORAL_TABLET | Freq: Three times a day (TID) | ORAL | 0 refills | Status: AC
  Filled 2023-10-15: qty 30, 5d supply, fill #0

## 2023-12-04 ENCOUNTER — Telehealth: Admitting: Physician Assistant

## 2023-12-04 ENCOUNTER — Other Ambulatory Visit (HOSPITAL_COMMUNITY): Payer: Self-pay

## 2023-12-04 DIAGNOSIS — R3989 Other symptoms and signs involving the genitourinary system: Secondary | ICD-10-CM | POA: Diagnosis not present

## 2023-12-04 MED ORDER — CEPHALEXIN 500 MG PO CAPS
500.0000 mg | ORAL_CAPSULE | Freq: Two times a day (BID) | ORAL | 0 refills | Status: AC
  Filled 2023-12-04: qty 14, 7d supply, fill #0

## 2023-12-04 NOTE — Patient Instructions (Signed)
 Camelia Rattler, thank you for joining [name], PA-C for today's virtual visit.  While this provider is not your primary care provider (PCP), if your PCP is located in our provider database this encounter information will be shared with them immediately following your visit.   A [facility] MyChart account gives you access to today's visit and all your visits, tests, and labs performed at [facility]  click here if you don't have a [facility] MyChart account or go to [email or web address]  Consent: (Patient) Camelia Rattler provided verbal consent for this virtual visit at the beginning of the encounter.  Current Medications:  Current Outpatient Medications:    acetaminophen  (TYLENOL ) 500 MG tablet, Take 500 mg by mouth every 6 (six) hours as needed., Disp: , Rfl:    cyclobenzaprine  (FLEXERIL ) 10 MG tablet, Take 1 tablet (10 mg total) by mouth at bedtime for muscle spasms and pain (Patient not taking: Reported on 01/31/2021), Disp: 20 tablet, Rfl: 0   diclofenac  Sodium (VOLTAREN ) 1 % GEL, Apply 2 g topically 4 (four) times daily as needed., Disp: 50 g, Rfl: 0   diphenhydramine -acetaminophen  (TYLENOL  PM) 25-500 MG TABS tablet, Take 2 tablets by mouth at bedtime as needed (sleep,pain). (Patient not taking: Reported on 01/31/2021), Disp: , Rfl:    famotidine  (PEPCID ) 20 MG tablet, Take 1 tablet (20 mg total) by mouth 2 (two) times daily., Disp: 14 tablet, Rfl: 0   fluticasone  (FLONASE ) 50 MCG/ACT nasal spray, Place 1 spray into both nostrils daily., Disp: 16 g, Rfl: 0   hydrOXYzine  (ATARAX /VISTARIL ) 25 MG tablet, Take 1 tablet (25 mg total) by mouth every 6 (six) hours. (Patient not taking: Reported on 01/31/2021), Disp: 12 tablet, Rfl: 0   IUD'S IU, 1 each by Intrauterine route continuous., Disp: , Rfl:    lidocaine  (XYLOCAINE ) 2 % solution, Use as directed 15 mLs in the mouth or throat as needed for mouth pain. (Patient not taking: Reported on 01/31/2021), Disp: 100 mL,  Rfl: 0   methocarbamol  (ROBAXIN ) 500 MG tablet, Take 2 tablets (1,000 mg total) by mouth every 8 (eight) hours as needed for muscle spasms., Disp: 20 tablet, Rfl: 0   pantoprazole  (PROTONIX ) 40 MG tablet, Take 1 tablet (40 mg total) by mouth daily. (Patient not taking: Reported on 01/31/2021), Disp: 30 tablet, Rfl: 3   phenol (CHLORASEPTIC) 1.4 % LIQD, Use as directed 2 sprays in the mouth or throat as needed for throat irritation / pain. (Patient not taking: Reported on 01/31/2021), Disp: , Rfl:    phentermine  37.5 MG capsule, Take 1 capsule (37.5 mg total) by mouth daily., Disp: 30 capsule, Rfl: 0   promethazine -dextromethorphan (PROMETHAZINE -DM) 6.25-15 MG/5ML syrup, Take 5 mLs by mouth every 6 (six) hours as needed for cough., Disp: 118 mL, Rfl: 0   Semaglutide -Weight Management (WEGOVY ) 0.25 MG/0.5ML SOAJ, Inject 0.25 mg into the skin once a week., Disp: 2 mL, Rfl: 0   tranexamic acid  (LYSTEDA ) 650 MG TABS tablet, Take 2 tablets (1,300 mg total) by mouth 3 (three) times daily for 5 days, Disp: 30 tablet, Rfl: 12   tranexamic acid  (LYSTEDA ) 650 MG TABS tablet, Take 2 tablets (1,300 mg total) by mouth 3 (three) times daily for 5 days, Disp: 30 tablet, Rfl: 12   Medications ordered in this encounter:  No orders of the defined types were placed in this encounter.    *If you need refills on other medications prior to your next appointment, please contact your pharmacy*  Follow-Up: Call back or seek  an in-person evaluation if the symptoms worsen or if the condition fails to improve as anticipated.  [facility] [phone]  Other Instructions Your symptoms are consistent with a bladder infection, also called acute cystitis. Please take your antibiotic (Keflex ) as directed until all pills are gone.  Stay very well hydrated.  Consider a daily probiotic (Align, Culturelle, or Activia) to help prevent stomach upset caused by the antibiotic.  Taking a probiotic daily may also help  prevent recurrent UTIs.  Also consider taking AZO (Phenazopyridine) tablets to help decrease pain with urination.  If you note any non-resolving, new, or worsening symptoms despite treatment, please seek an in-person evaluation ASAP.   Urinary Tract Infection A urinary tract infection (UTI) can occur any place along the urinary tract. The tract includes the kidneys, ureters, bladder, and urethra. A type of germ called bacteria often causes a UTI. UTIs are often helped with antibiotic medicine.  HOME CARE  If given, take antibiotics as told by your doctor. Finish them even if you start to feel better. Drink enough fluids to keep your pee (urine) clear or pale yellow. Avoid tea, drinks with caffeine, and bubbly (carbonated) drinks. Pee often. Avoid holding your pee in for a long time. Pee before and after having sex (intercourse). Wipe from front to back after you poop (bowel movement) if you are a woman. Use each tissue only once. GET HELP RIGHT AWAY IF:  You have back pain. You have lower belly (abdominal) pain. You have chills. You feel sick to your stomach (nauseous). You throw up (vomit). Your burning or discomfort with peeing does not go away. You have a fever. Your symptoms are not better in 3 days. MAKE SURE YOU:  Understand these instructions. Will watch your condition. Will get help right away if you are not doing well or get worse. Document Released: 10/23/2007 Document Revised: 01/29/2012 Document Reviewed: 12/05/2011 [facility] Patient Information 2015 [place], [place]. This information is not intended to replace advice given to you by your health care provider. Make sure you discuss any questions you have with your health care provider.    If you have been instructed to have an in-person evaluation today at a local Urgent Care facility, please use the link below. It will take you to a list of all of our available [facility], including address, phone number and  hours of operation. Please do not delay care.  [facility]  If you or a family member do not have a primary care provider, use the link below to schedule a visit and establish care. When you choose a [facility] primary care physician or advanced practice provider, you gain a long-term partner in health. Find a Primary Care Provider  Learn more about [facility] in-office and virtual care options: [facility] - Get Care Now

## 2023-12-04 NOTE — Progress Notes (Signed)
 Virtual Visit Consent   Leslie Hatfield, you are scheduled for a virtual visit with a [facility] provider today. Just as with appointments in the office, your consent must be obtained to participate. Your consent will be active for this visit and any virtual visit you may have with one of our providers in the next 365 days. If you have a MyChart account, a copy of this consent can be sent to you electronically.  As this is a virtual visit, video technology does not allow for your provider to perform a traditional examination. This may limit your provider's ability to fully assess your condition. If your provider identifies any concerns that need to be evaluated in person or the need to arrange testing (such as labs, EKG, etc.), we will make arrangements to do so. Although advances in technology are sophisticated, we cannot ensure that it will always work on either your end or our end. If the connection with a video visit is poor, the visit may have to be switched to a telephone visit. With either a video or telephone visit, we are not always able to ensure that we have a secure connection.  By engaging in this virtual visit, you consent to the provision of healthcare and authorize for your insurance to be billed (if applicable) for the services provided during this visit. Depending on your insurance coverage, you may receive a charge related to this service.  I need to obtain your verbal consent now. Are you willing to proceed with your visit today? Leslie Hatfield has provided verbal consent on 12/04/2023 for a virtual visit (video or telephone). Leslie Hatfield, [place]  Date: 12/04/2023 3:09 PM   Virtual Visit via Video Note   I, Leslie Hatfield, connected with  Leslie Hatfield  ([id number], [birth year]) on 12/04/23 at  3:15 PM EDT by a video-enabled telemedicine application and verified that I am speaking with the correct person using two identifiers.  Location: Patient: Virtual Visit  Location Patient: Home Provider: Virtual Visit Location Provider: Home Office   I discussed the limitations of evaluation and management by telemedicine and the availability of in person appointments. The patient expressed understanding and agreed to proceed.    History of Present Illness: Leslie Hatfield is a 38 y.o. who identifies as a female who was assigned female at birth, and is being seen today for possible UTI.  Endorses symptoms starting over the past couple of days after her menstrual period ended. Notes urinary urgency, frequency along with dysuria and some malaise. Denies fever, chills, back pain. Some mild nausea without vomiting. Denies vaginal pain or discharge.   HPI: HPI  Problems:  Patient Active Problem List   Diagnosis Date Noted   S/P cesarean section 02/18/2015    Allergies: No Known Allergies Medications:  Current Outpatient Medications:    cephALEXin  (KEFLEX ) 500 MG capsule, Take 1 capsule (500 mg total) by mouth 2 (two) times daily for 7 days., Disp: 14 capsule, Rfl: 0   acetaminophen  (TYLENOL ) 500 MG tablet, Take 500 mg by mouth every 6 (six) hours as needed., Disp: , Rfl:    fluticasone  (FLONASE ) 50 MCG/ACT nasal spray, Place 1 spray into both nostrils daily., Disp: 16 g, Rfl: 0   phentermine  37.5 MG capsule, Take 1 capsule (37.5 mg total) by mouth daily., Disp: 30 capsule, Rfl: 0   tranexamic acid  (LYSTEDA ) 650 MG TABS tablet, Take 2 tablets (1,300 mg total) by mouth 3 (three) times daily for 5 days, Disp: 30 tablet, Rfl: 12  tranexamic acid  (LYSTEDA ) 650 MG TABS tablet, Take 2 tablets (1,300 mg total) by mouth 3 (three) times daily for 5 days, Disp: 30 tablet, Rfl: 12  Observations/Objective: Patient is well-developed, well-nourished in no acute distress.  Resting comfortably  at home.  Head is normocephalic, atraumatic.  No labored breathing.  Speech is clear and coherent with logical content.  Patient is alert and oriented at baseline.   Assessment and  Plan: 1. Suspected UTI (Primary) - cephALEXin  (KEFLEX ) 500 MG capsule; Take 1 capsule (500 mg total) by mouth 2 (two) times daily for 7 days.  Dispense: 14 capsule; Refill: 0  Classic UTI symptoms with absence of alarm signs or symptoms. Prior history of UTI. Will treat empirically with Keflex  for suspected uncomplicated cystitis. Supportive measures and OTC medications reviewed. Strict in-person evaluation precautions discussed.    Follow Up Instructions: I discussed the assessment and treatment plan with the patient. The patient was provided an opportunity to ask questions and all were answered. The patient agreed with the plan and demonstrated an understanding of the instructions.  A copy of instructions were sent to the patient via MyChart unless otherwise noted below.   The patient was advised to call back or seek an in-person evaluation if the symptoms worsen or if the condition fails to improve as anticipated.    Leslie Velma Lunger, PA-C

## 2023-12-11 NOTE — Addendum Note (Signed)
 Addended by: GLADIS ELSIE BROCKS on: 12/11/2023 08:33 AM   Modules accepted: Level of Service

## 2023-12-28 ENCOUNTER — Emergency Department (HOSPITAL_BASED_OUTPATIENT_CLINIC_OR_DEPARTMENT_OTHER)
Admission: EM | Admit: 2023-12-28 | Discharge: 2023-12-28 | Disposition: A | Discharge status: Home / Self Care | Attending: Emergency Medicine | Admitting: Emergency Medicine

## 2023-12-28 ENCOUNTER — Other Ambulatory Visit: Payer: Self-pay

## 2023-12-28 ENCOUNTER — Encounter (HOSPITAL_BASED_OUTPATIENT_CLINIC_OR_DEPARTMENT_OTHER): Payer: Self-pay

## 2023-12-28 ENCOUNTER — Emergency Department (HOSPITAL_BASED_OUTPATIENT_CLINIC_OR_DEPARTMENT_OTHER)

## 2023-12-28 DIAGNOSIS — W1839XA Other fall on same level, initial encounter: Secondary | ICD-10-CM | POA: Diagnosis not present

## 2023-12-28 DIAGNOSIS — S6992XA Unspecified injury of left wrist, hand and finger(s), initial encounter: Secondary | ICD-10-CM | POA: Diagnosis present

## 2023-12-28 DIAGNOSIS — S62112A Displaced fracture of triquetrum [cuneiform] bone, left wrist, initial encounter for closed fracture: Secondary | ICD-10-CM | POA: Diagnosis not present

## 2023-12-28 DIAGNOSIS — S62102A Fracture of unspecified carpal bone, left wrist, initial encounter for closed fracture: Secondary | ICD-10-CM | POA: Diagnosis not present

## 2023-12-28 DIAGNOSIS — M25562 Pain in left knee: Secondary | ICD-10-CM | POA: Diagnosis not present

## 2023-12-28 DIAGNOSIS — S6292XA Unspecified fracture of left wrist and hand, initial encounter for closed fracture: Secondary | ICD-10-CM | POA: Diagnosis not present

## 2023-12-28 LAB — PREGNANCY, URINE: Preg Test, Ur: NEGATIVE

## 2023-12-28 MED ORDER — KETOROLAC TROMETHAMINE 15 MG/ML IJ SOLN
15.0000 mg | Freq: Once | INTRAMUSCULAR | Status: AC
  Administered 2023-12-28: 15 mg via INTRAMUSCULAR
  Filled 2023-12-28: qty 1

## 2023-12-28 NOTE — ED Triage Notes (Signed)
 Patient was doing a [place] when she fell onto her left side and hurt her left wrist and knee. She is able to ambulate. The left wrist is bothering her the most, she reports she cannot bend it. She is in a compression splint. Some swelling and bruising is noted.

## 2023-12-28 NOTE — Discharge Instructions (Addendum)
 You were seen for a carpal fracture.  Please keep your splint on and follow-up with hand surgery.  You may alternate Tylenol /Motrin  as needed for pain.  Thank you for letting us  treat you today. After reviewing your imaging, I feel you are safe to go home. Please follow up with your PCP in the next several days and provide them with your records from this visit. Return to the Emergency Room if pain becomes severe or symptoms worsen.

## 2023-12-28 NOTE — ED Provider Notes (Signed)
 [name] Provider Note   CSN: [id number] Arrival date & time: 12/28/23  1605     Patient presents with: Fall, Wrist Pain, and Knee Pain   Leslie Hatfield is a 38 y.o. female presents today after a mechanical fall onto her left side.  Patient reports left wrist pain, swelling, and ecchymosis.  Patient also reports decreased range of motion.  Patient denies numbness, weakness, head injury, nausea, vomiting, LOC, any other pain at this time.    Fall  Wrist Pain  Knee Pain      Prior to Admission medications   Medication Sig Start Date End Date Taking? Authorizing Provider  acetaminophen  (TYLENOL ) 500 MG tablet Take 500 mg by mouth every 6 (six) hours as needed.    [provider]  fluticasone  (FLONASE ) 50 MCG/ACT nasal spray Place 1 spray into both nostrils daily. 07/27/22   Hazen Darryle BRAVO, FNP  phentermine  37.5 MG capsule Take 1 capsule (37.5 mg total) by mouth daily. 10/15/23     tranexamic acid  (LYSTEDA ) 650 MG TABS tablet Take 2 tablets (1,300 mg total) by mouth 3 (three) times daily for 5 days 03/12/23     tranexamic acid  (LYSTEDA ) 650 MG TABS tablet Take 2 tablets (1,300 mg total) by mouth 3 (three) times daily for 5 days 05/22/23       Allergies: Patient has no known allergies.    Review of Systems  Musculoskeletal:  Positive for arthralgias.    Updated Vital Signs BP (!) 141/79 (BP Location: Right Arm)   Pulse 97   Temp 98.2 F (36.8 C)   Resp 18   SpO2 100%   Physical Exam Vitals and nursing note reviewed.  Constitutional:      General: She is not in acute distress.    Appearance: She is well-developed.  HENT:     Head: Normocephalic and atraumatic.  Eyes:     Conjunctiva/sclera: Conjunctivae normal.  Cardiovascular:     Rate and Rhythm: Normal rate and regular rhythm.     Heart sounds: No murmur heard. Pulmonary:     Effort: Pulmonary effort is normal. No respiratory distress.     Breath sounds:  Normal breath sounds.  Abdominal:     Palpations: Abdomen is soft.     Tenderness: There is no abdominal tenderness.  Musculoskeletal:        General: Swelling and tenderness present.     Cervical back: Neck supple.     Comments: Mild swelling to the dorsum of the left hand and wrist.  Patient has pain with wrist flexion.  Patient denies  Skin:    General: Skin is warm and dry.     Capillary Refill: Capillary refill takes less than 2 seconds.  Neurological:     General: No focal deficit present.     Mental Status: She is alert and oriented to person, place, and time.  Psychiatric:        Mood and Affect: Mood normal.     (all labs ordered are listed, but only abnormal results are displayed) Labs Reviewed  PREGNANCY, URINE    EKG: None  Radiology: DG Wrist Complete Left Result Date: 12/28/2023 CLINICAL DATA:  Fall with wrist pain and limited range of motion. EXAM: LEFT WRIST - COMPLETE 3+ VIEW COMPARISON:  None Available. FINDINGS: Tiny avulsion fracture along the dorsal aspect of the wrist. Calcific density along the volar aspect of the distal radius and ulna on the lateral view, possibly related to previous  trauma. IMPRESSION: Triquetral avulsion fracture. Electronically Signed   By: Newell Eke M.D.   On: 12/28/2023 17:16     Procedures   Medications Ordered in the ED  ketorolac  (TORADOL ) 15 MG/ML injection 15 mg (has no administration in time range)                                    Medical Decision Making Amount and/or Complexity of Data Reviewed Labs: ordered. Radiology: ordered.  Risk Prescription drug management.   This patient presents to the ED for concern of fall differential diagnosis includes fracture, dislocation, musculoskeletal pain  Lab Tests:  I Ordered, and personally interpreted labs.  The pertinent results include: Negative pregnancy   Imaging Studies ordered:  I ordered imaging studies including left wrist x-ray I independently  visualized and interpreted imaging which showed triquetral avulsion fracture I agree with the radiologist interpretation   Medicines ordered and prescription drug management:  I ordered medication including Toradol     I have reviewed the patients home medicines and have made adjustments as needed   Problem List / ED Course:  Patient placed in volar forearm splint.  I personally checked that the patient was neurovascularly intact after splint placement. Considered for admission or further workup however patient's vital signs, physical exam, and imaging are reassuring.  Patient symptoms likely due to triquetrium fracture.  Patient placed in splint and advised to alternate Tylenol  Motrin  as needed for pain.  Patient to follow-up with orthopedics for further evaluation workup.  I feel patient safe for discharge at this time.     Final diagnoses:  Displaced fracture of triquetrum (cuneiform) bone, left wrist, initial encounter for closed fracture    ED Discharge Orders     None          Francis Ileana SAILOR, PA-C 12/28/23 1751    Geraldene Hamilton, MD 12/29/23 [id number]

## 2023-12-30 ENCOUNTER — Ambulatory Visit (HOSPITAL_BASED_OUTPATIENT_CLINIC_OR_DEPARTMENT_OTHER): Admitting: Physician Assistant

## 2023-12-30 ENCOUNTER — Encounter (HOSPITAL_BASED_OUTPATIENT_CLINIC_OR_DEPARTMENT_OTHER): Payer: Self-pay | Admitting: Physician Assistant

## 2023-12-30 DIAGNOSIS — M79642 Pain in left hand: Secondary | ICD-10-CM | POA: Insufficient documentation

## 2023-12-30 NOTE — Progress Notes (Signed)
 Office Visit Note   Patient: Leslie Hatfield           Date of Birth: 12/14/1985           MRN: [id number] Visit Date: 12/30/2023              Requested by: Kip Righter, MD [address] [place],  [place] [address] PCP: Kip Righter, MD   Assessment & Plan: Visit Diagnoses:  1. 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  She is right-hand dominant she is 2 days status post falling onto her hand several times when she was doing a cross-country run.  She did have pain on the dorsum of her hand and went was seen and evaluated at an urgent care.  X-rays there is suggested a avulsion fracture of the triquetrum.  She has no significant wrist pain she has good motion of her fingers but is focally tender over the dorsal aspect of her hand.  She has good extension flexion but is quite painful.  I think to put her in a short arm cast for a couple weeks that can be taken off rex-rayed and reevaluation at that time unfortunately she will have to be off work for 2 weeks we have provided her information to do so  Follow-Up Instructions: Return in about 2 weeks (around 01/13/2024).   Orders:  No orders of the defined types were placed in this encounter.  No orders of the defined types were placed in this encounter.     Procedures: No procedures performed   Clinical Data: No additional findings.   Subjective: Chief Complaint  Patient presents with   Left Wrist - Pain    HPI pleasant 37 year old woman comes in today with a 2-day history of dorsal left hand pain after falling several times while doing a cross-country run.  Never had problem with her hand before she is right-hand dominant.  She went to an urgent care currently immobilized in a splint  Review of Systems  All other systems reviewed and are negative.    Objective: Vital Signs: There were no vitals taken for this visit.  Physical Exam Constitutional:       Appearance: Normal appearance.  Pulmonary:     Effort: Pulmonary effort is normal.  Skin:    General: Skin is warm and dry.  Neurological:     General: No focal deficit present.     Mental Status: She is alert and oriented to person, place, and time.  Psychiatric:        Mood and Affect: Mood normal.        Behavior: Behavior normal.     Ortho Exam Examination of her left hand she has brisk capillary refill strong radial pulse she has good range of motion and strength not tested secondary to pain.  She does have tenderness over the dorsal mid of her hand.  No ecchymosis but does have associated swelling minimal tenderness over the radius and ulna Specialty Comments:  No specialty comments available.  Imaging: No results found.   PMFS History: Patient Active Problem List   Diagnosis Date Noted   Pain in left hand 12/30/2023   S/P cesarean section 02/18/2015   Past Medical History:  Diagnosis Date   Anemia    No pertinent past medical history     Family History  Problem Relation Age of Onset   Heart disease Maternal Grandmother  Heart disease Maternal Grandfather    Diabetes Paternal Grandfather    Diabetes Paternal Grandmother     Past Surgical History:  Procedure Laterality Date   CESAREAN SECTION  12/04/2010   Procedure: CESAREAN SECTION;  Surgeon: Nathanel LELON Bunker;  Location: [facility];  Service: Gynecology;  Laterality: N/A;   CESAREAN SECTION     CESAREAN SECTION N/A 02/18/2015   Procedure: CESAREAN SECTION;  Surgeon: Krystal Deaner, MD;  Location: [facility];  Service: Obstetrics;  Laterality: N/A;   vaginal poloyps     vaginal polyps removed     Social History   Occupational History   Not on file  Tobacco Use   Smoking status: Never   Smokeless tobacco: Never  Vaping Use   Vaping status: Never Used  Substance and Sexual Activity   Alcohol use: No   Drug use: No   Sexual activity: Yes    Birth control/protection: None

## 2023-12-31 ENCOUNTER — Other Ambulatory Visit (HOSPITAL_COMMUNITY): Payer: Self-pay

## 2023-12-31 MED ORDER — TRANEXAMIC ACID 650 MG PO TABS
1300.0000 mg | ORAL_TABLET | Freq: Three times a day (TID) | ORAL | 0 refills | Status: AC
  Filled 2023-12-31 (×2): qty 30, 5d supply, fill #0

## 2024-01-02 ENCOUNTER — Other Ambulatory Visit (HOSPITAL_COMMUNITY): Payer: Self-pay

## 2024-01-15 ENCOUNTER — Ambulatory Visit: Admitting: Physician Assistant

## 2024-01-15 ENCOUNTER — Ambulatory Visit (INDEPENDENT_AMBULATORY_CARE_PROVIDER_SITE_OTHER)

## 2024-01-15 ENCOUNTER — Encounter (HOSPITAL_BASED_OUTPATIENT_CLINIC_OR_DEPARTMENT_OTHER): Payer: Self-pay | Admitting: Physician Assistant

## 2024-01-15 ENCOUNTER — Ambulatory Visit (INDEPENDENT_AMBULATORY_CARE_PROVIDER_SITE_OTHER): Admitting: Physician Assistant

## 2024-01-15 DIAGNOSIS — M79642 Pain in left hand: Secondary | ICD-10-CM

## 2024-01-15 DIAGNOSIS — S62102A Fracture of unspecified carpal bone, left wrist, initial encounter for closed fracture: Secondary | ICD-10-CM | POA: Diagnosis not present

## 2024-01-15 NOTE — Progress Notes (Signed)
 Office Visit Note   Patient: Leslie Hatfield           Date of Birth: [birth year]           MRN: [id number] Visit Date: 01/15/2024              Requested by: Kip Righter, MD [address] [place],  [place] [address] PCP: Kip Righter, MD  Chief Complaint  Patient presents with   Left Wrist - Follow-up      HPI: Patient is a pleasant 38 year old woman who works as a Engineer, civil (consulting) in the [employer].  She is now 2 weeks status post injury to her left wrist which was a repetitive injury after falling several times while doing a cross-country run.  She at that time was suspicious for a avulsion fracture at the triquetrum.  She has been in a cast for 2 weeks.  Assessment & Plan: Visit Diagnoses:  1. Pain in left hand     Plan: Will put her in a removable wrist splint today she take this off to shower and wash her hands.  Would like her to come back in 2 weeks for reexamination  Follow-Up Instructions: No follow-ups on file.   Ortho Exam  Patient is alert, oriented, no adenopathy, well-dressed, normal affect, normal respiratory effort. Examination of the left wrist no swelling compartments are soft and compressible brisk capillary refill she still has some tenderness to palpation has good active extension and flexion    Imaging: No results found. No images are attached to the encounter.  Labs: Lab Results  Component Value Date   REPTSTATUS 07/30/2022 FINAL 07/27/2022   CULT  07/27/2022    NO GROUP A STREP (S.PYOGENES) ISOLATED Performed at [facility], [address], [place], [place] [address]      Lab Results  Component Value Date   ALBUMIN 4.3 06/13/2022   ALBUMIN 4.5 05/24/2020   ALBUMIN 4.1 04/25/2012    Lab Results  Component Value Date   MG 1.5 04/25/2012   No results found for: VD25OH  No results found for: PREALBUMIN    Latest Ref Rng & Units 06/13/2022    7:50 PM 05/24/2020   11:42 PM 09/11/2017    3:24 PM  CBC EXTENDED   WBC 4.0 - 10.5 K/uL 8.4  6.6  5.9   RBC 3.87 - 5.11 MIL/uL 5.24  5.10  4.37   Hemoglobin 12.0 - 15.0 g/dL 85.4  84.8  86.7   HCT 36.0 - 46.0 % 45.6  46.9  40.2   Platelets 150 - 400 K/uL 328  300  266   NEUT# 1.7 - 7.7 K/uL 7.2   3.9   Lymph# 0.7 - 4.0 K/uL 0.8   1.6      There is no height or weight on file to calculate BMI.  Orders:  Orders Placed This Encounter  Procedures   DG Wrist Complete Left   No orders of the defined types were placed in this encounter.    Procedures: No procedures performed  Clinical Data: No additional findings.  ROS:  All other systems negative, except as noted in the HPI. Review of Systems  Objective: Vital Signs: There were no vitals taken for this visit.  Specialty Comments:  No specialty comments available.  PMFS History: Patient Active Problem List   Diagnosis Date Noted   Pain in left hand 12/30/2023   S/P cesarean section 02/18/2015   Past Medical History:  Diagnosis Date  Anemia    No pertinent past medical history     Family History  Problem Relation Age of Onset   Heart disease Maternal Grandmother    Heart disease Maternal Grandfather    Diabetes Paternal Grandfather    Diabetes Paternal Grandmother     Past Surgical History:  Procedure Laterality Date   CESAREAN SECTION  12/04/2010   Procedure: CESAREAN SECTION;  Surgeon: Nathanel LELON Bunker;  Location: [facility];  Service: Gynecology;  Laterality: N/A;   CESAREAN SECTION     CESAREAN SECTION N/A 02/18/2015   Procedure: CESAREAN SECTION;  Surgeon: Krystal Deaner, MD;  Location: [facility];  Service: Obstetrics;  Laterality: N/A;   vaginal poloyps     vaginal polyps removed     Social History   Occupational History   Not on file  Tobacco Use   Smoking status: Never   Smokeless tobacco: Never  Vaping Use   Vaping status: Never Used  Substance and Sexual Activity   Alcohol use: No   Drug use: No   Sexual activity: Yes    Birth control/protection: None

## 2024-01-21 DIAGNOSIS — Z0000 Encounter for general adult medical examination without abnormal findings: Secondary | ICD-10-CM | POA: Diagnosis not present

## 2024-01-21 DIAGNOSIS — E785 Hyperlipidemia, unspecified: Secondary | ICD-10-CM | POA: Diagnosis not present

## 2024-01-21 DIAGNOSIS — Z6841 Body Mass Index (BMI) 40.0 and over, adult: Secondary | ICD-10-CM | POA: Diagnosis not present

## 2024-01-21 DIAGNOSIS — R7309 Other abnormal glucose: Secondary | ICD-10-CM | POA: Diagnosis not present

## 2024-01-21 DIAGNOSIS — D649 Anemia, unspecified: Secondary | ICD-10-CM | POA: Diagnosis not present

## 2024-01-27 ENCOUNTER — Ambulatory Visit (INDEPENDENT_AMBULATORY_CARE_PROVIDER_SITE_OTHER): Admitting: Physician Assistant

## 2024-01-27 ENCOUNTER — Encounter: Payer: Self-pay | Admitting: Physician Assistant

## 2024-01-27 ENCOUNTER — Other Ambulatory Visit (INDEPENDENT_AMBULATORY_CARE_PROVIDER_SITE_OTHER): Payer: Self-pay

## 2024-01-27 DIAGNOSIS — M25532 Pain in left wrist: Secondary | ICD-10-CM

## 2024-01-27 NOTE — Progress Notes (Signed)
 Office Visit Note   Patient: Leslie Hatfield           Date of Birth: February 09, 1986           MRN: [id number] Visit Date: 01/27/2024              Requested by: Kip Righter, MD [address] [place],  [place] [address] PCP: Kip Righter, MD  No chief complaint on file.     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  She had been in a cast for 2 weeks followed by removable splint.  She reports she is doing much better.  She would like to return to work  Assessment & Plan: Visit Diagnoses:  1. Pain in left wrist     Plan: Her exam is fairly benign she is still concerned that she has a little soreness in the wrist she is about a month out.  I suggested that she work with occupational therapy for a couple visits she is willing to do this may follow-up with me if she gets no better we will refer her to Dr. Arlinda  Follow-Up Instructions: Return if symptoms worsen or fail to improve.   Ortho Exam  Patient is alert, oriented, no adenopathy, well-dressed, normal affect, normal respiratory effort. Examination of her left hand she has a strong radial pulse she has good strength with flexion extension but does have a small amount of pain over the dorsum of her hand.  She has she has good supination and pronation no real tenderness to palpation    Imaging: No results found. No images are attached to the encounter.  Labs: Lab Results  Component Value Date   REPTSTATUS 07/30/2022 FINAL 07/27/2022   CULT  07/27/2022    NO GROUP A STREP (S.PYOGENES) ISOLATED Performed at [facility], [address] [place], [place] [address]      Lab Results  Component Value Date   ALBUMIN 4.3 06/13/2022   ALBUMIN 4.5 05/24/2020   ALBUMIN 4.1 04/25/2012    Lab Results  Component Value Date   MG 1.5 04/25/2012   No results found for: VD25OH  No results found for: PREALBUMIN    Latest Ref  Rng & Units 06/13/2022    7:50 PM 05/24/2020   11:42 PM 09/11/2017    3:24 PM  CBC EXTENDED  WBC 4.0 - 10.5 K/uL 8.4  6.6  5.9   RBC 3.87 - 5.11 MIL/uL 5.24  5.10  4.37   Hemoglobin 12.0 - 15.0 g/dL 85.4  84.8  86.7   HCT 36.0 - 46.0 % 45.6  46.9  40.2   Platelets 150 - 400 K/uL 328  300  266   NEUT# 1.7 - 7.7 K/uL 7.2   3.9   Lymph# 0.7 - 4.0 K/uL 0.8   1.6      There is no height or weight on file to calculate BMI.  Orders:  Orders Placed This Encounter  Procedures   XR Wrist Complete Left   Ambulatory referral to Occupational Therapy   No orders of the defined types were placed in this encounter.    Procedures: No procedures performed  Clinical Data: No additional findings.  ROS:  All other systems negative, except as noted in the HPI. Review of Systems  Objective: Vital Signs: LMP 01/08/2024 (Approximate)   Specialty Comments:  No specialty comments available.  PMFS History: Patient Active Problem List   Diagnosis  Date Noted   Pain in left hand 12/30/2023   S/P cesarean section 02/18/2015   Past Medical History:  Diagnosis Date   Anemia    No pertinent past medical history     Family History  Problem Relation Age of Onset   Heart disease Maternal Grandmother    Heart disease Maternal Grandfather    Diabetes Paternal Grandfather    Diabetes Paternal Grandmother     Past Surgical History:  Procedure Laterality Date   CESAREAN SECTION  12/04/2010   Procedure: CESAREAN SECTION;  Surgeon: Nathanel LELON Bunker;  Location: [facility];  Service: Gynecology;  Laterality: N/A;   CESAREAN SECTION     CESAREAN SECTION N/A 02/18/2015   Procedure: CESAREAN SECTION;  Surgeon: Krystal Deaner, MD;  Location: [facility];  Service: Obstetrics;  Laterality: N/A;   vaginal poloyps     vaginal polyps removed     Social History   Occupational History   Not on file  Tobacco Use   Smoking status: Never   Smokeless tobacco: Never  Vaping Use   Vaping status: Never Used   Substance and Sexual Activity   Alcohol use: No   Drug use: No   Sexual activity: Yes    Birth control/protection: None

## 2024-02-09 NOTE — Therapy (Incomplete)
 OUTPATIENT OCCUPATIONAL THERAPY ORTHO EVALUATION  Patient Name: Leslie Hatfield MRN: [id number] DOB:Nov 20, 1985, 38 y.o., female Today's Date: 02/09/2024  PCP: Kip FELIX MD REFERRING PROVIDER:  Persons, Ronal Dragon, [place]    END OF SESSION:   Past Medical History:  Diagnosis Date   Anemia    No pertinent past medical history    Past Surgical History:  Procedure Laterality Date   CESAREAN SECTION  12/04/2010   Procedure: CESAREAN SECTION;  Surgeon: Nathanel LELON Bunker;  Location: [facility];  Service: Gynecology;  Laterality: N/A;   CESAREAN SECTION     CESAREAN SECTION N/A 02/18/2015   Procedure: CESAREAN SECTION;  Surgeon: Krystal Deaner, MD;  Location: [facility];  Service: Obstetrics;  Laterality: N/A;   vaginal poloyps     vaginal polyps removed     Patient Active Problem List   Diagnosis Date Noted   Pain in left hand 12/30/2023   S/P cesarean section 02/18/2015    ONSET DATE: 12/28/23 DOI   REFERRING DIAG: F74.467 (ICD-10-CM) - Pain in left wrist   THERAPY DIAG:  No diagnosis found.  Rationale for Evaluation and Treatment: Rehabilitation  SUBJECTIVE:   SUBJECTIVE STATEMENT: Now 6 weeks post injury. She states falling during an extreme race and breaking her Lt wrist. She was casted for 2 weeks ***. Now she states ***.      PERTINENT HISTORY: Left hand eval and treat avulsion fracture triquetrum   PRECAUTIONS: {Therapy precautions:24002}  RED FLAGS: {PT Red Flags:29287}   WEIGHT BEARING RESTRICTIONS: {Yes ***/No:24003}  PAIN:  Are you having pain? {OPRCPAIN:27236}  FALLS: Has patient fallen in last 6 months? {fallsyesno:27318}  LIVING ENVIRONMENT: Lives with: {OPRC lives with:25569::lives with their family} Lives in: {Lives in:25570} Stairs: {opstairs:27293} Has following equipment at home: {Assistive devices:23999}  PLOF: {PLOF:24004}  PATIENT GOALS: ***  NEXT MD VISIT: ***   OBJECTIVE: (All objective assessments below are from initial evaluation  on: 02/11/24 unless otherwise specified.)   HAND DOMINANCE: Right ***  ADLs: Overall ADLs: States decreased ability to grab, hold household objects, pain and difficulty to open containers, perform FMS tasks (manipulate fasteners on clothing), mild to moderate bathing problems as well. ***   FUNCTIONAL OUTCOME MEASURES: Eval: Patient Specific Functional Scale: *** (***, ***, ***)  (Higher Score  =  Better Ability for the Selected Tasks)     Quick DASH ***% impairment today  (Higher % Score  =  More Impairment)     Patient Rated Wrist Evaluation (PRWE): Pain: ***/50; Function: ***/50; Total Score: ***/100 (Higher Score  =  More Pain and/or Debility)    UPPER EXTREMITY ROM     Shoulder to Wrist AROM Right eval Left eval  Shoulder flexion    Shoulder abduction    Shoulder extension    Shoulder internal rotation    Shoulder external rotation    Elbow flexion    Elbow extension    Forearm supination    Forearm pronation     Wrist flexion    Wrist extension    Wrist ulnar deviation    Wrist radial deviation    Functional dart thrower's motion (F-DTM) in ulnar flexion    F-DTM in radial extension     (Blank rows = not tested)   Hand AROM Right eval Left eval  Full Fist Ability (or Gap to Distal Palmar Crease)    Thumb Opposition  (Kapandji Scale)     Thumb MCP (0-60)    Thumb IP (0-80)    Thumb Radial Abduction Span  Thumb Palmar Abduction Span     Index MCP (0-90)     Index PIP (0-100)     Index DIP (0-70)      Long MCP (0-90)      Long PIP (0-100)      Long DIP (0-70)      Ring MCP (0-90)      Ring PIP (0-100)      Ring DIP (0-70)      Little MCP (0-90)      Little PIP (0-100)      Little DIP (0-70)      (Blank rows = not tested)   UPPER EXTREMITY MMT:    Eval: *** NT at eval due to recent and still healing injuries. Will be tested when appropriate.   MMT Right TBD Left TBD  Shoulder flexion    Shoulder abduction    Shoulder adduction    Shoulder  extension    Shoulder internal rotation    Shoulder external rotation    Middle trapezius    Lower trapezius    Elbow flexion    Elbow extension    Forearm supination    Forearm pronation    Wrist flexion    Wrist extension    Wrist ulnar deviation    Wrist radial deviation    (Blank rows = not tested)  HAND FUNCTION: Eval: Observed weakness in affected *** hand.  Grip strength Right: *** lbs, Left: *** lbs   COORDINATION: Eval: Observed coordination impairments with affected *** hand. Box and Blocks Test: *** Blocks today (*** is [facility]); 9 Hole Peg Test Right: ***sec, Left: *** sec (*** sec is WFL)   SENSATION: Eval:  Light touch intact today,   *** though diminished around sx area    EDEMA:   Eval: *** Mildly swollen in *** hand and wrist today, ***cm circumferentially around ***  COGNITION: Eval: Overall cognitive status: WFL for evaluation today ***  OBSERVATIONS:   Eval: ***   TODAY'S TREATMENT:  Post-evaluation treatment: ***     PATIENT EDUCATION: Education details: See tx section above for details  Person educated: Patient Education method: Engineer, structural, Teach back, Handouts  Education comprehension: States and demonstrates understanding, Additional Education required    HOME EXERCISE PROGRAM: See tx section above for details    GOALS: Goals reviewed with patient? Yes   SHORT TERM GOALS: (STG required if POC>30 days) Target Date: ***  Pt will obtain protective, custom orthotic. Goal status: TBD/PRN,  MET ***  2.  Pt will demo/state understanding of initial HEP to improve pain levels and prerequisite motion. Goal status: INITIAL   LONG TERM GOALS: Target Date: ***  Pt will improve functional ability by decreased impairment per PSFS assessment from *** to *** or better, for better quality of life. Goal status: INITIAL  2.  Pt will improve grip strength in *** hand from ***lbs to at least ***lbs for functional use at home and in  IADLs. Goal status: INITIAL  3.  Pt will improve A/ROM in *** from *** to at least ***, to have functional motion for tasks like reach and grasp.  Goal status: INITIAL  4.  Pt will improve strength in *** from *** MMT to at least *** MMT to have increased functional ability to carry out selfcare and higher-level homecare tasks with less difficulty. Goal status: INITIAL  5.  Pt will improve coordination skills in ***, as seen by within functional limit score on *** testing to have increased functional ability  to carry out fine motor tasks (fasteners, etc.) and more complex, coordinated IADLs (meal prep, sports, etc.).  Goal status: INITIAL  6.  Pt will decrease pain at worst from ***/10 to ***/10 or better to have better sleep and occupational participation in daily roles. Goal status: INITIAL   ASSESSMENT:  CLINICAL IMPRESSION: Patient is a *** y.o. *** who was seen today for occupational therapy evaluation for ***.  The patient will benefit from outpatient occupational therapy to decrease symptoms, improve functional upper extremity use, and increase quality of life.  PERFORMANCE DEFICITS: in functional skills including {OT physical skills:25468}, cognitive skills including problem solving and safety awareness, and psychosocial skills including coping strategies, environmental adaptation, habits, and routines and behaviors.   IMPAIRMENTS: are limiting patient from ADLs, IADLs, rest and sleep, and leisure.   COMORBIDITIES: {Comorbidities:25485} that affects occupational performance. Patient will benefit from skilled OT to address above impairments and improve overall function.  MODIFICATION OR ASSISTANCE TO COMPLETE EVALUATION: {OT modification:25474}  OT OCCUPATIONAL PROFILE AND HISTORY: {OT PROFILE AND HISTORY:25484}  CLINICAL DECISION MAKING: {OT CDM:25475}  REHAB POTENTIAL: {rehabpotential:25112}  EVALUATION COMPLEXITY: {Evaluation complexity:25115}      PLAN:  OT  FREQUENCY: 1-2x/week  OT DURATION: *** weeks through *** and up to *** total visits as needed   PLANNED INTERVENTIONS: 97535 self care/ADL training, 02889 therapeutic exercise, 97530 therapeutic activity, 97112 neuromuscular re-education, 97140 manual therapy, 97035 ultrasound, [id number] electrical stimulation (manual), [id number] Orthotic Initial, [id number] Orthotic/Prosthetic subsequent, compression bandaging, Dry needling, energy conservation, coping strategies training, and patient/family education  RECOMMENDED OTHER SERVICES: none now  ***  CONSULTED AND AGREED WITH PLAN OF CARE: Patient  PLAN FOR NEXT SESSION:   Review initial HEP and recommendations ***   Melvenia Ada, OTR/L, CHT  02/09/2024, 4:48 PM

## 2024-02-11 ENCOUNTER — Encounter: Admitting: Rehabilitative and Restorative Service Providers"

## 2024-02-24 NOTE — Therapy (Incomplete)
 OUTPATIENT OCCUPATIONAL THERAPY ORTHO EVALUATION  Patient Name: Leslie Hatfield MRN: [id number] DOB:[birth year], 38 y.o., female Today's Date: 02/24/2024  PCP: Kip FELIX MD REFERRING PROVIDER:  Persons, Ronal Dragon, [place]    END OF SESSION:   Past Medical History:  Diagnosis Date   Anemia    No pertinent past medical history    Past Surgical History:  Procedure Laterality Date   CESAREAN SECTION  12/04/2010   Procedure: CESAREAN SECTION;  Surgeon: Nathanel LELON Bunker;  Location: [facility];  Service: Gynecology;  Laterality: N/A;   CESAREAN SECTION     CESAREAN SECTION N/A 02/18/2015   Procedure: CESAREAN SECTION;  Surgeon: Krystal Deaner, MD;  Location: [facility];  Service: Obstetrics;  Laterality: N/A;   vaginal poloyps     vaginal polyps removed     Patient Active Problem List   Diagnosis Date Noted   Pain in left hand 12/30/2023   S/P cesarean section 02/18/2015    ONSET DATE: 12/28/23 DOI   REFERRING DIAG: F74.467 (ICD-10-CM) - Pain in left wrist   THERAPY DIAG:  No diagnosis found.  Rationale for Evaluation and Treatment: Rehabilitation  SUBJECTIVE:   SUBJECTIVE STATEMENT: Now 8 weeks post injury. She states falling during an extreme race and breaking her Lt wrist. She was casted for 2 weeks ***. Now she states ***.      PERTINENT HISTORY: Left hand eval and treat avulsion fracture triquetrum   PRECAUTIONS: {Therapy precautions:24002}  RED FLAGS: {PT Red Flags:29287}   WEIGHT BEARING RESTRICTIONS: {Yes ***/No:24003}  PAIN:  Are you having pain? {OPRCPAIN:27236}  FALLS: Has patient fallen in last 6 months? {fallsyesno:27318}  LIVING ENVIRONMENT: Lives with: {OPRC lives with:25569::lives with their family} Lives in: {Lives in:[address]70} Stairs: {opstairs:27293} Has following equipment at home: {Assistive devices:23999}  PLOF: {PLOF:24004}  PATIENT GOALS: ***  NEXT MD VISIT: ***   OBJECTIVE: (All objective assessments below are from initial evaluation  on: 02/25/24 unless otherwise specified.)   HAND DOMINANCE: Right ***  ADLs: Overall ADLs: States decreased ability to grab, hold household objects, pain and difficulty to open containers, perform FMS tasks (manipulate fasteners on clothing), mild to moderate bathing problems as well. ***   FUNCTIONAL OUTCOME MEASURES: Eval: Patient Specific Functional Scale: *** (***, ***, ***)  (Higher Score  =  Better Ability for the Selected Tasks)     Quick DASH ***% impairment today  (Higher % Score  =  More Impairment)     Patient Rated Wrist Evaluation (PRWE): Pain: ***/50; Function: ***/50; Total Score: ***/100 (Higher Score  =  More Pain and/or Debility)    UPPER EXTREMITY ROM     Shoulder to Wrist AROM Left eval  Shoulder flexion   Shoulder abduction   Shoulder extension   Shoulder internal rotation   Shoulder external rotation   Elbow flexion   Elbow extension   Forearm supination   Forearm pronation    Wrist flexion   Wrist extension   Wrist ulnar deviation   Wrist radial deviation   Functional dart thrower's motion (F-DTM) in ulnar flexion   F-DTM in radial extension    (Blank rows = not tested)   Hand AROM Left eval  Full Fist Ability (or Gap to Distal Palmar Crease)   Thumb Opposition  (Kapandji Scale)    Thumb MCP (0-60)   Thumb IP (0-80)   Thumb Radial Abduction Span    Thumb Palmar Abduction Span    Index MCP (0-90)    Index PIP (0-100)    Index DIP (  0-70)    Long MCP (0-90)    Long PIP (0-100)    Long DIP (0-70)    Ring MCP (0-90)    Ring PIP (0-100)    Ring DIP (0-70)    Little MCP (0-90)    Little PIP (0-100)    Little DIP (0-70)    (Blank rows = not tested)   UPPER EXTREMITY MMT:       MMT Left 02/25/24  Shoulder flexion   Shoulder abduction   Shoulder adduction   Shoulder extension   Shoulder internal rotation   Shoulder external rotation   Middle trapezius   Lower trapezius   Elbow flexion   Elbow extension   Forearm supination    Forearm pronation   Wrist flexion   Wrist extension   Wrist ulnar deviation   Wrist radial deviation   (Blank rows = not tested)  HAND FUNCTION: Eval: Observed weakness in affected *** hand.  Grip strength Right: *** lbs, Left: *** lbs   COORDINATION: Eval: Observed coordination impairments with affected *** hand. Box and Blocks Test: *** Blocks today (*** is [facility]); 9 Hole Peg Test Right: ***sec, Left: *** sec (*** sec is WFL)   SENSATION: Eval:  Light touch intact today,   *** though diminished around sx area    EDEMA:   Eval: *** Mildly swollen in *** hand and wrist today, ***cm circumferentially around ***  COGNITION: Eval: Overall cognitive status: WFL for evaluation today ***  OBSERVATIONS:   Eval: ***   TODAY'S TREATMENT:  Post-evaluation treatment: ***     PATIENT EDUCATION: Education details: See tx section above for details  Person educated: Patient Education method: Engineer, structural, Teach back, Handouts  Education comprehension: States and demonstrates understanding, Additional Education required    HOME EXERCISE PROGRAM: See tx section above for details    GOALS: Goals reviewed with patient? Yes   SHORT TERM GOALS: (STG required if POC>30 days) Target Date: ***  Pt will obtain protective, custom orthotic. Goal status: TBD/PRN,  MET ***  2.  Pt will demo/state understanding of initial HEP to improve pain levels and prerequisite motion. Goal status: INITIAL   LONG TERM GOALS: Target Date: ***  Pt will improve functional ability by decreased impairment per PSFS assessment from *** to *** or better, for better quality of life. Goal status: INITIAL  2.  Pt will improve grip strength in *** hand from ***lbs to at least ***lbs for functional use at home and in IADLs. Goal status: INITIAL  3.  Pt will improve A/ROM in *** from *** to at least ***, to have functional motion for tasks like reach and grasp.  Goal status: INITIAL  4.  Pt will  improve strength in *** from *** MMT to at least *** MMT to have increased functional ability to carry out selfcare and higher-level homecare tasks with less difficulty. Goal status: INITIAL  5.  Pt will improve coordination skills in ***, as seen by within functional limit score on *** testing to have increased functional ability to carry out fine motor tasks (fasteners, etc.) and more complex, coordinated IADLs (meal prep, sports, etc.).  Goal status: INITIAL  6.  Pt will decrease pain at worst from ***/10 to ***/10 or better to have better sleep and occupational participation in daily roles. Goal status: INITIAL   ASSESSMENT:  CLINICAL IMPRESSION: Patient is a 38 y.o. female who was seen today for occupational therapy evaluation for ***.  The patient will benefit from outpatient  occupational therapy to decrease symptoms, improve functional upper extremity use, and increase quality of life.  PERFORMANCE DEFICITS: in functional skills including {OT physical skills:25468}, cognitive skills including problem solving and safety awareness, and psychosocial skills including coping strategies, environmental adaptation, habits, and routines and behaviors.   IMPAIRMENTS: are limiting patient from ADLs, IADLs, rest and sleep, and leisure.   COMORBIDITIES: {Comorbidities:25485} that affects occupational performance. Patient will benefit from skilled OT to address above impairments and improve overall function.  MODIFICATION OR ASSISTANCE TO COMPLETE EVALUATION: {OT modification:25474}  OT OCCUPATIONAL PROFILE AND HISTORY: {OT PROFILE AND HISTORY:25484}  CLINICAL DECISION MAKING: {OT CDM:25475}  REHAB POTENTIAL: {rehabpotential:25112}  EVALUATION COMPLEXITY: {Evaluation complexity:25115}      PLAN:  OT FREQUENCY: 1-2x/week  OT DURATION: *** weeks through *** and up to *** total visits as needed   PLANNED INTERVENTIONS: 97535 self care/ADL training, 02889 therapeutic exercise, 97530  therapeutic activity, 97112 neuromuscular re-education, 97140 manual therapy, 97035 ultrasound, [id number] electrical stimulation (manual), [id number] Orthotic Initial, [id number] Orthotic/Prosthetic subsequent, compression bandaging, Dry needling, energy conservation, coping strategies training, and patient/family education  RECOMMENDED OTHER SERVICES: none now  ***  CONSULTED AND AGREED WITH PLAN OF CARE: Patient  PLAN FOR NEXT SESSION:   Review initial HEP and recommendations ***   Shishir Krantz [place]ina, OTR/L, CHT  02/24/2024, 8:01 AM

## 2024-02-25 ENCOUNTER — Encounter: Admitting: Rehabilitative and Restorative Service Providers"

## 2024-03-22 ENCOUNTER — Encounter: Payer: Self-pay | Admitting: Radiology

## 2024-06-21 ENCOUNTER — Encounter (HOSPITAL_BASED_OUTPATIENT_CLINIC_OR_DEPARTMENT_OTHER): Payer: Self-pay

## 2024-06-21 ENCOUNTER — Emergency Department (HOSPITAL_BASED_OUTPATIENT_CLINIC_OR_DEPARTMENT_OTHER): Admission: EM | Admit: 2024-06-21 | Discharge: 2024-06-21 | Disposition: A | Discharge status: Home / Self Care

## 2024-06-21 ENCOUNTER — Emergency Department (HOSPITAL_BASED_OUTPATIENT_CLINIC_OR_DEPARTMENT_OTHER): Admitting: Radiology

## 2024-06-21 ENCOUNTER — Emergency Department (HOSPITAL_BASED_OUTPATIENT_CLINIC_OR_DEPARTMENT_OTHER)

## 2024-06-21 ENCOUNTER — Other Ambulatory Visit: Payer: Self-pay

## 2024-06-21 DIAGNOSIS — K5792 Diverticulitis of intestine, part unspecified, without perforation or abscess without bleeding: Secondary | ICD-10-CM

## 2024-06-21 DIAGNOSIS — M25552 Pain in left hip: Secondary | ICD-10-CM | POA: Insufficient documentation

## 2024-06-21 DIAGNOSIS — M25562 Pain in left knee: Secondary | ICD-10-CM | POA: Insufficient documentation

## 2024-06-21 DIAGNOSIS — R000 Tachycardia, unspecified: Secondary | ICD-10-CM | POA: Insufficient documentation

## 2024-06-21 DIAGNOSIS — K5732 Diverticulitis of large intestine without perforation or abscess without bleeding: Secondary | ICD-10-CM | POA: Insufficient documentation

## 2024-06-21 LAB — URINALYSIS, ROUTINE W REFLEX MICROSCOPIC
Bacteria, UA: NONE SEEN
Bilirubin Urine: NEGATIVE
Glucose, UA: NEGATIVE mg/dL
Ketones, ur: NEGATIVE mg/dL
Nitrite: NEGATIVE
Protein, ur: NEGATIVE mg/dL
Specific Gravity, Urine: 1.024 (ref 1.005–1.030)
pH: 5.5 (ref 5.0–8.0)

## 2024-06-21 LAB — COMPREHENSIVE METABOLIC PANEL WITH GFR
ALT: 19 U/L (ref 0–44)
AST: 19 U/L (ref 15–41)
Albumin: 4.2 g/dL (ref 3.5–5.0)
Alkaline Phosphatase: 100 U/L (ref 38–126)
Anion gap: 13 (ref 5–15)
BUN: 12 mg/dL (ref 6–20)
CO2: 26 mmol/L (ref 22–32)
Calcium: 9.6 mg/dL (ref 8.9–10.3)
Chloride: 98 mmol/L (ref 98–111)
Creatinine, Ser: 0.54 mg/dL (ref 0.44–1.00)
GFR, Estimated: >60 mL/min
Glucose, Bld: 108 mg/dL — ABNORMAL HIGH (ref 70–99)
Potassium: 4.1 mmol/L (ref 3.5–5.1)
Sodium: 136 mmol/L (ref 135–145)
Total Bilirubin: 0.4 mg/dL (ref 0.0–1.2)
Total Protein: 8.1 g/dL (ref 6.5–8.1)

## 2024-06-21 LAB — CBC
HCT: 37.4 % (ref 36.0–46.0)
Hemoglobin: 12.1 g/dL (ref 12.0–15.0)
MCH: 27.6 pg (ref 26.0–34.0)
MCHC: 32.4 g/dL (ref 30.0–36.0)
MCV: 85.2 fL (ref 80.0–100.0)
Platelets: 306 10*3/uL (ref 150–400)
RBC: 4.39 MIL/uL (ref 3.87–5.11)
RDW: 14.1 % (ref 11.5–15.5)
WBC: 9.4 10*3/uL (ref 4.0–10.5)
nRBC: 0 % (ref 0.0–0.2)

## 2024-06-21 LAB — PREGNANCY, URINE: Preg Test, Ur: NEGATIVE

## 2024-06-21 LAB — LIPASE, BLOOD: Lipase: 31 U/L (ref 11–51)

## 2024-06-21 MED ORDER — ACETAMINOPHEN 325 MG PO TABS
650.0000 mg | ORAL_TABLET | Freq: Once | ORAL | Status: AC
  Administered 2024-06-21: 650 mg via ORAL
  Filled 2024-06-21: qty 2

## 2024-06-21 MED ORDER — HYDROCODONE-ACETAMINOPHEN 5-325 MG PO TABS: 1.0000 | ORAL_TABLET | Freq: Four times a day (QID) | ORAL | 0 refills | Status: AC | PRN

## 2024-06-21 MED ORDER — AMOXICILLIN-POT CLAVULANATE 875-125 MG PO TABS
1.0000 | ORAL_TABLET | Freq: Three times a day (TID) | ORAL | 0 refills | Status: DC
  Filled 2024-06-21: qty 21, 7d supply, fill #0

## 2024-06-21 MED ORDER — IOHEXOL 300 MG/ML  SOLN
100.0000 mL | Freq: Once | INTRAMUSCULAR | Status: AC | PRN
  Administered 2024-06-21: 100 mL via INTRAVENOUS

## 2024-06-21 MED ORDER — HYDROCODONE-ACETAMINOPHEN 5-325 MG PO TABS
1.0000 | ORAL_TABLET | Freq: Four times a day (QID) | ORAL | 0 refills | Status: DC | PRN
  Filled 2024-06-21: qty 10, 3d supply, fill #0

## 2024-06-21 MED ORDER — AMOXICILLIN-POT CLAVULANATE 875-125 MG PO TABS: 1.0000 | ORAL_TABLET | Freq: Three times a day (TID) | ORAL | 0 refills | Status: AC

## 2024-06-21 NOTE — ED Provider Notes (Signed)
 " [place] EMERGENCY DEPARTMENT AT [facility] Provider Note   CSN: [id number] Arrival date & time: 06/21/24  1214     Patient presents with: Leslie Hatfield is a 39 y.o. female with past medical history significant for previous c section, obesity who presents with two concerns today. Patient endorses left hip and knee pain after fall on ice on Saturday. Also reports decreased appetite and abdominal pain starting last night. Worsened since sitting in the ED. No improvement with tylenol . No fever, chills, dysuria, vaginal discharge, no constipation, diarrhea, nausea, vomiting.    Fall       Prior to Admission medications  Medication Sig Start Date End Date Taking? Authorizing Provider  amoxicillin -clavulanate (AUGMENTIN ) 875-125 MG tablet Take 1 tablet by mouth every 8 (eight) hours. 06/21/24  Yes Jemila Camille H, PA-C  HYDROcodone -acetaminophen  (NORCO/VICODIN) 5-325 MG tablet Take 1 tablet by mouth every 6 (six) hours as needed. 06/21/24  Yes Salar Molden H, PA-C  acetaminophen  (TYLENOL ) 500 MG tablet Take 500 mg by mouth every 6 (six) hours as needed.    [provider]  fluticasone  (FLONASE ) 50 MCG/ACT nasal spray Place 1 spray into both nostrils daily. 07/27/22   Hazen Darryle BRAVO, FNP  phentermine  37.5 MG capsule Take 1 capsule (37.5 mg total) by mouth daily. 10/15/23     tranexamic acid  (LYSTEDA ) 650 MG TABS tablet Take 2 tablets (1,300 mg total) by mouth 3 (three) times daily for 5 days 03/12/23     tranexamic acid  (LYSTEDA ) 650 MG TABS tablet Take 2 tablets (1,300 mg total) by mouth 3 (three) times daily for 5 days 05/22/23       Allergies: Patient has no known allergies.    Review of Systems  All other systems reviewed and are negative.   Updated Vital Signs BP 132/82   Pulse 99   Temp 97.9 F (36.6 C)   Resp 18   Ht 5' 4 (1.626 m)   Wt 122.5 kg   LMP 06/19/2024   SpO2 98%   BMI 46.35 kg/m   Physical Exam Vitals and nursing note  reviewed.  Constitutional:      General: She is not in acute distress.    Appearance: Normal appearance.  HENT:     Head: Normocephalic and atraumatic.  Eyes:     General:        Right eye: No discharge.        Left eye: No discharge.  Cardiovascular:     Rate and Rhythm: Regular rhythm. Tachycardia present.     Pulses: Normal pulses.     Heart sounds: No murmur heard.    No friction rub. No gallop.  Pulmonary:     Effort: Pulmonary effort is normal.     Breath sounds: Normal breath sounds.  Abdominal:     General: Bowel sounds are normal.     Palpations: Abdomen is soft.     Comments: Focal ttp in llq, some guarding, no rebound, rigidity  Musculoskeletal:     Comments: Mild ttp with no stepoff, deformity of left hip, left knee. No effusion, Normal range of motion of left hip, left knee with some mild pain on passive flexion, extension. No rotation, no leg shortening.  Skin:    General: Skin is warm and dry.     Capillary Refill: Capillary refill takes less than 2 seconds.  Neurological:     Mental Status: She is alert and oriented to person, place, and time.  Psychiatric:  Mood and Affect: Mood normal.        Behavior: Behavior normal.     (all labs ordered are listed, but only abnormal results are displayed) Labs Reviewed  COMPREHENSIVE METABOLIC PANEL WITH GFR - Abnormal; Notable for the following components:      Result Value   Glucose, Bld 108 (*)    All other components within normal limits  URINALYSIS, ROUTINE W REFLEX MICROSCOPIC - Abnormal; Notable for the following components:   Hgb urine dipstick LARGE (*)    Leukocytes,Ua SMALL (*)    All other components within normal limits  LIPASE, BLOOD  CBC  PREGNANCY, URINE    EKG: None  Radiology: CT ABDOMEN PELVIS W CONTRAST Result Date: 06/21/2024 EXAM: CT ABDOMEN AND PELVIS WITH CONTRAST 06/21/2024 04:36:24 PM TECHNIQUE: CT of the abdomen and pelvis was performed with the administration of 100 mL of  iohexol  (OMNIPAQUE ) 300 MG/ML solution. Multiplanar reformatted images are provided for review. Automated exposure control, iterative reconstruction, and/or weight-based adjustment of the mA/kV was utilized to reduce the radiation dose to as low as reasonably achievable. COMPARISON: 06/14/2022 CLINICAL HISTORY: Abdominal pain, acute, nonlocalized. Acute, nonlocalized abdominal pain. FINDINGS: LOWER CHEST: No acute abnormality. LIVER: Diffuse hepatic steatosis. GALLBLADDER AND BILE DUCTS: Gallbladder is unremarkable. No biliary ductal dilatation. SPLEEN: No acute abnormality. PANCREAS: No acute abnormality. ADRENAL GLANDS: No acute abnormality. KIDNEYS, URETERS AND BLADDER: No stones in the kidneys or ureters. No hydronephrosis. No perinephric or periureteral stranding. The urinary bladder is distended without focal abnormality. GI AND BOWEL: Stomach demonstrates no acute abnormality. There is no bowel obstruction. Scattered colonic diverticulosis. There is an inflamed diverticulum within the mid to distal sigmoid colon. No peridiverticular abscess. PERITONEUM AND RETROPERITONEUM: Trace free fluid in the pelvis, likely reactive. No free air. VASCULATURE: Aorta is normal in caliber. LYMPH NODES: No lymphadenopathy. REPRODUCTIVE ORGANS: No acute abnormality. No abnormal appearance of the uterus and ovaries. BONES AND SOFT TISSUES: No acute osseous abnormality. No focal soft tissue abnormality. IMPRESSION: 1. Acute, uncomplicated diverticulitis of the mid to distal sigmoid colon. No peridiverticular abscess or pneumoperitoneum. 2. Trace free fluid in the pelvis, likely reactive. 3. Mild diffuse hepatic steatosis. Electronically signed by: Rogelia Myers MD 06/21/2024 04:51 PM EST RP Workstation: [id number]   DG Hip Unilat W or Wo Pelvis 2-3 Views Left Result Date: 06/21/2024 CLINICAL DATA:  [place] EXAM: DG HIP (WITH OR WITHOUT PELVIS) 2-3V LEFT COMPARISON:  None Available. FINDINGS: Frontal view of the pelvis as well as  frontal and frogleg lateral views of the left hip are obtained. Assessment is limited by patient body habitus. No acute fracture, subluxation, or dislocation. Joint spaces are well preserved. Sacroiliac joints are unremarkable. IMPRESSION: 1. Unremarkable pelvis and left hip. Electronically Signed   By: Ozell Daring M.D.   On: 06/21/2024 15:58   DG Knee Complete 4 Views Left Result Date: 06/21/2024 EXAM: 4 VIEW(S) XRAY OF THE LEFT KNEE 06/21/2024 01:34:00 PM COMPARISON: None available. CLINICAL HISTORY: Fall. FINDINGS: BONES AND JOINTS: No acute fracture. No malalignment. No significant joint effusion. No significant degenerative changes. SOFT TISSUES: Unremarkable. IMPRESSION: 1. No evidence of acute traumatic injury. Electronically signed by: Donnice Mania MD 06/21/2024 01:47 PM EST RP Workstation: [id number]     Procedures   Medications Ordered in the ED  acetaminophen  (TYLENOL ) tablet 650 mg (650 mg Oral Given 06/21/24 1614)  iohexol  (OMNIPAQUE ) 300 MG/ML solution 100 mL (100 mLs Intravenous Contrast Given 06/21/24 1634)  Medical Decision Making Amount and/or Complexity of Data Reviewed Labs: ordered. Radiology: ordered.   This patient is a 39 y.o. female  who presents to the ED for concern of abdominal pain, hip pain, knee pain.   Differential diagnoses prior to evaluation: The emergent differential diagnosis includes, but is not limited to,  The causes of generalized abdominal pain include but are not limited to AAA, mesenteric ischemia, appendicitis, diverticulitis, DKA, gastritis, gastroenteritis, AMI, nephrolithiasis, pancreatitis, peritonitis, adrenal insufficiency,lead poisoning, iron toxicity, intestinal ischemia, constipation, UTI,SBO/LBO, splenic rupture, biliary disease, IBD, IBS, PUD, or hepatitis --considered fracture, dislocation, versus effusion, ligamentous injury, versus muscular soreness of the left hip, left knee.. This is not an  exhaustive differential.   Past Medical History / Co-morbidities / Social History: Previous C-section, otherwise overall noncontributory  Physical Exam: Physical exam performed. The pertinent findings include: Focal ttp in llq, some guarding, no rebound, rigidity   Mild ttp with no stepoff, deformity of left hip, left knee. No effusion, Normal range of motion of left hip, left knee with some mild pain on passive flexion, extension. No rotation, no leg shortening.   Vital signs stable in the emergency department mild tachycardia on arrival, pulse 115, mild hypertension, 142/94, both resolved on reevaluation  Lab Tests/Imaging studies: I personally interpreted labs/imaging and the pertinent results include: CBC unremarkable, CMP overall unremarkable, mildly elevated nonfasting glucose at 108.  Normal lipase, negative serum pregnancy test.  UA does show some large hemoglobin, small leukocytes, with some squamous cell contamination, overall very low clinical suspicion for UTI, suspect contaminant..  Plain from radiograph of the left knee, left hip with no evidence of acute fracture, dislocation, or other abnormality, CT abdomen pelvis with contrast shows 1. Acute, uncomplicated diverticulitis of the mid to distal sigmoid colon. No  peridiverticular abscess or pneumoperitoneum.  2. Trace free fluid in the pelvis, likely reactive.  3. Mild diffuse hepatic steatosis.  I agree with the radiologist interpretation.    Medications: I ordered medication including Tylenol  for pain, discharged with short course of pain medicine, encouraged full liquid diet, Augmentin  for diverticulitis.  I have reviewed the patients home medicines and have made adjustments as needed.   Disposition: After consideration of the diagnostic results and the patients response to treatment, I feel that patient stable for discharge, encouraged PCP follow-up, treatment for diverticulitis including liquid diet as above.    emergency department workup does not suggest an emergent condition requiring admission or immediate intervention beyond what has been performed at this time. The plan is: As above. The patient is safe for discharge and has been instructed to return immediately for worsening symptoms, change in symptoms or any other concerns.   Final diagnoses:  Diverticulitis  Pain of left hip  Acute pain of left knee    ED Discharge Orders          Ordered    HYDROcodone -acetaminophen  (NORCO/VICODIN) 5-325 MG tablet  Every 6 hours PRN        06/21/24 1711    amoxicillin -clavulanate (AUGMENTIN ) 875-125 MG tablet  Every 8 hours        06/21/24 1711               Jonathin Heinicke, Hurley H, PA-C 06/21/24 1716  "

## 2024-06-21 NOTE — Discharge Instructions (Addendum)
 Please use Tylenol  for pain.  You may use 1000 mg of Tylenol  every 6 hours.  Not to exceed 4 g of Tylenol  within 24 hours.  You can use the stronger narcotic pain medication in place of Tylenol  for severe break through pain.  If you take the narcotic pain medication that we prescribed recommend that you also take a laxative such as MiraLAX or Dulcolax every day that you take the narcotic pain medicine, and drink plenty of fluids, 50 to 64 ounces to prevent any constipation.  I would stick to a full liquid diet for 5 to 7 days, progressing to solids as tolerated.

## 2024-06-21 NOTE — ED Notes (Signed)
 Pt d/c instructions, medications, and follow-up care reviewed with pt. Pt verbalized understanding and had no further questions at time of d/c. Pt CA&Ox4, ambulatory, and in NAD at time of d/c

## 2024-06-22 ENCOUNTER — Other Ambulatory Visit (HOSPITAL_BASED_OUTPATIENT_CLINIC_OR_DEPARTMENT_OTHER): Payer: Self-pay
